# Patient Record
Sex: Female | Born: 2000 | Race: White | Hispanic: No | Marital: Single | Smoking: Never smoker
Health system: Southern US, Community
[De-identification: ages and names within clinical notes are randomized; demographics above are authoritative.]

## PROBLEM LIST (undated history)

## (undated) DIAGNOSIS — E162 Hypoglycemia, unspecified: Secondary | ICD-10-CM

## (undated) DIAGNOSIS — F4310 Post-traumatic stress disorder, unspecified: Secondary | ICD-10-CM

## (undated) DIAGNOSIS — F419 Anxiety disorder, unspecified: Secondary | ICD-10-CM

## (undated) HISTORY — PX: WRIST SURGERY: SHX841

## (undated) HISTORY — PX: OTHER SURGICAL HISTORY: SHX169

---
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
## 2018-03-29 NOTE — Progress Notes (Deleted)
Psychiatric Initial Adult Assessment   Patient Identification: Veronica Best MRN:  [id number] Date of Evaluation:  03/29/2018 Referral Source: *** Chief Complaint:   Visit Diagnosis: No diagnosis found.  History of Present Illness:   Veronica Best is a 17 y.o. year old female with a history of  , who is referred for   Associated Signs/Symptoms: Depression Symptoms:  {DEPRESSION SYMPTOMS:20000} (Hypo) Manic Symptoms:  {BHH MANIC SYMPTOMS:22872} Anxiety Symptoms:  {BHH ANXIETY SYMPTOMS:22873} Psychotic Symptoms:  {BHH PSYCHOTIC SYMPTOMS:22874} PTSD Symptoms: {BHH PTSD SYMPTOMS:22875}  Past Psychiatric History:  Outpatient:  Psychiatry admission:  Previous suicide attempt:  Past trials of medication:  History of violence:   Previous Psychotropic Medications: {YES/NO:21197}  Substance Abuse History in the last 12 months:  {yes no:314532}  Consequences of Substance Abuse: {BHH CONSEQUENCES OF SUBSTANCE ABUSE:22880}  Past Medical History:  Past Medical History:  Diagnosis Date  . Hypoglycemia     Past Surgical History:  Procedure Laterality Date  . tubes in ears      Family Psychiatric History: ***  Family History:  Family History  Problem Relation Age of Onset  . Cancer Neg Hx     Social History:   Social History   Socioeconomic History  . Marital status: Single    Spouse name: Not on file  . Number of children: Not on file  . Years of education: Not on file  . Highest education level: Not on file  Occupational History  . Not on file  Social Needs  . Financial resource strain: Not on file  . Food insecurity:    Worry: Not on file    Inability: Not on file  . Transportation needs:    Medical: Not on file    Non-medical: Not on file  Tobacco Use  . Smoking status: Never Smoker  . Smokeless tobacco: Never Used  Substance and Sexual Activity  . Alcohol use: No  . Drug use: No  . Sexual activity: Never  Lifestyle  . Physical activity:    Days per  week: Not on file    Minutes per session: Not on file  . Stress: Not on file  Relationships  . Social connections:    Talks on phone: Not on file    Gets together: Not on file    Attends religious service: Not on file    Active member of club or organization: Not on file    Attends meetings of clubs or organizations: Not on file    Relationship status: Not on file  Other Topics Concern  . Not on file  Social History Narrative  . Not on file    Additional Social History: ***  Allergies:   Allergies  Allergen Reactions  . Latex Itching    Metabolic Disorder Labs: No results found for: HGBA1C, MPG No results found for: PROLACTIN No results found for: CHOL, TRIG, HDL, CHOLHDL, VLDL, LDLCALC No results found for: TSH  Therapeutic Level Labs: No results found for: LITHIUM No results found for: CBMZ No results found for: VALPROATE  Current Medications: No current outpatient medications on file.   No current facility-administered medications for this visit.     Musculoskeletal: Strength & Muscle Tone: within normal limits Gait & Station: normal Patient leans: N/A  Psychiatric Specialty Exam: ROS  There were no vitals taken for this visit.There is no height or weight on file to calculate BMI.  General Appearance: Fairly Groomed  Eye Contact:  Good  Speech:  Clear and Coherent  Volume:  Normal  Mood:  {BHH MOOD:22306}  Affect:  {Affect (PAA):22687}  Thought Process:  Coherent  Orientation:  Full (Time, Place, and Person)  Thought Content:  Logical  Suicidal Thoughts:  {ST/HT (PAA):22692}  Homicidal Thoughts:  {ST/HT (PAA):22692}  Memory:  Immediate;   Good  Judgement:  {Judgement (PAA):22694}  Insight:  {Insight (PAA):22695}  Psychomotor Activity:  Normal  Concentration:  Concentration: Good and Attention Span: Good  Recall:  Good  Fund of Knowledge:Good  Language: Good  Akathisia:  No  Handed:  Right  AIMS (if indicated):  not done  Assets:   Communication Skills Desire for Improvement  ADL's:  Intact  Cognition: WNL  Sleep:  {BHH GOOD/FAIR/POOR:22877}   Screenings:   Assessment and Plan:  Veronica Best is a 17 y.o. year old female with a history of , who is referred for    Neysa Hottereina Dicky Boer, MD 12/10/20192:24 PM

## 2018-04-04 ENCOUNTER — Ambulatory Visit (HOSPITAL_COMMUNITY): Payer: Self-pay | Admitting: Psychiatry

## 2020-01-08 ENCOUNTER — Other Ambulatory Visit: Payer: Self-pay

## 2020-01-08 ENCOUNTER — Encounter (HOSPITAL_COMMUNITY): Payer: Self-pay | Admitting: Emergency Medicine

## 2020-01-08 ENCOUNTER — Emergency Department (HOSPITAL_COMMUNITY)
Admission: EM | Admit: 2020-01-08 | Discharge: 2020-01-08 | Disposition: A | Discharge status: Home / Self Care | Payer: Self-pay | Attending: Emergency Medicine | Admitting: Emergency Medicine

## 2020-01-08 ENCOUNTER — Emergency Department (HOSPITAL_COMMUNITY): Payer: Self-pay

## 2020-01-08 DIAGNOSIS — Z91040 Latex allergy status: Secondary | ICD-10-CM | POA: Insufficient documentation

## 2020-01-08 DIAGNOSIS — S63501A Unspecified sprain of right wrist, initial encounter: Secondary | ICD-10-CM | POA: Insufficient documentation

## 2020-01-08 DIAGNOSIS — W5512XA Struck by horse, initial encounter: Secondary | ICD-10-CM | POA: Insufficient documentation

## 2020-01-08 MED ORDER — IBUPROFEN 600 MG PO TABS: 600.0000 mg | ORAL_TABLET | Freq: Four times a day (QID) | ORAL | 0 refills | Status: AC | PRN

## 2020-01-08 NOTE — Discharge Instructions (Addendum)
Your wrist pain is likely due to a sprain.  Continue to wear your brace.  Take ibuprofen as needed for pain.  Call and follow-up with hand specialist for further management.

## 2020-01-08 NOTE — ED Provider Notes (Signed)
[facility] EMERGENCY DEPARTMENT Provider Note   CSN: [id number] Arrival date & time: 01/08/20  1031     History Chief Complaint  Patient presents with  . Wrist Injury    Right    Veronica Best is a 19 y.o. female.  The history is provided by the patient. No language interpreter was used.  Wrist Injury Associated symptoms: no fever      19 year old female presenting for evaluation of wrist pain.  Patient report about 40 days ago she injured her right nondominant wrist when she was holding her horse and the horse got spooked and pulled away from her.  She complaining of progressive worsening sharp burning pain to her wrist radiates to her hand and towards elbow when she moves with certain direction.  Pain is moderate in severity but not improving despite wearing a brace for support.  No associated fever no weakness no bruising or swelling noted.  She has been taking over-the-counter pain medication at home.  She is concerned because her symptoms is worsening instead of improving.  Past Medical History:  Diagnosis Date  . Hypoglycemia     There are no problems to display for this patient.   Past Surgical History:  Procedure Laterality Date  . tubes in ears       OB History   No obstetric history on file.     Family History  Problem Relation Age of Onset  . Cancer Neg Hx     Social History   Tobacco Use  . Smoking status: Never Smoker  . Smokeless tobacco: Never Used  Substance Use Topics  . Alcohol use: No  . Drug use: No    Home Medications Prior to Admission medications   Not on File    Allergies    Latex  Review of Systems   Review of Systems  Constitutional: Negative for fever.  Musculoskeletal: Positive for arthralgias.  Skin: Negative for wound.  Neurological: Negative for numbness.    Physical Exam Updated Vital Signs BP 122/80 (BP Location: Left Arm)   Pulse 80   Temp 97.9 F (36.6 C) (Oral)   Resp 18   Ht 5\' 2"  (1.575 m)   Wt 58.9  kg   LMP 01/07/2020   SpO2 100%   BMI 23.76 kg/m   Physical Exam Vitals and nursing note reviewed.  Constitutional:      General: She is not in acute distress.    Appearance: She is well-developed.  HENT:     Head: Atraumatic.  Eyes:     Conjunctiva/sclera: Conjunctivae normal.  Musculoskeletal:        General: Tenderness (Right wrist: Tenderness to the ulnar aspect of the wrist with normal wrist flexion extension supination and pronation.  Normal grip strength, able to move all fingers with intact distal pulses and brisk cap refill.  Radial pulse 2+.) present.     Cervical back: Neck supple.  Skin:    Findings: No rash.  Neurological:     Mental Status: She is alert.     ED Results / Procedures / Treatments   Labs (all labs ordered are listed, but only abnormal results are displayed) Labs Reviewed - No data to display  EKG None  Radiology DG Wrist Complete Right  Result Date: 01/08/2020 CLINICAL DATA:  Patient was walking the horse and horse pulled away from her, injury right wrist on August 9. EXAM: RIGHT WRIST - COMPLETE 3+ VIEW COMPARISON:  None. FINDINGS: There is no evidence of fracture or  dislocation. There is no evidence of arthropathy or other focal bone abnormality. Soft tissues are unremarkable. IMPRESSION: Negative. Electronically Signed   By: Emmaline Kluver M.D.   On: 01/08/2020 12:20    Procedures Procedures (including critical care time)  Medications Ordered in ED Medications - No data to display  ED Course  I have reviewed the triage vital signs and the nursing notes.  Pertinent labs & imaging results that were available during my care of the patient were reviewed by me and considered in my medical decision making (see chart for details).    MDM Rules/Calculators/A&P                          BP 122/80 (BP Location: Left Arm)   Pulse 80   Temp 97.9 F (36.6 C) (Oral)   Resp 18   Ht 5\' 2"  (1.575 m)   Wt 58.9 kg   LMP 01/07/2020   SpO2  100%   BMI 23.76 kg/m   Final Clinical Impression(s) / ED Diagnoses Final diagnoses:  Right wrist sprain, initial encounter    Rx / DC Orders ED Discharge Orders         Ordered    ibuprofen (ADVIL) 600 MG tablet  Every 6 hours PRN        01/08/20 1227         12:24 PM Patient here with pain to her right nondominant wrist ongoing for more than a month when she was walking her horse and the horse pulled away from her.  She has full range of motion throughout her right wrist.  X-ray unremarkable.  I suspect this is likely to be musculoskeletal pain, likely a sprain.  Patient does have a brace available which I encourage patient to continue to use it and I will give referral to hand specialist for outpatient follow-up.   01/10/20, PA-C 01/08/20 1227    01/10/20, MD 01/09/20 1335

## 2020-01-08 NOTE — ED Triage Notes (Signed)
Patient was walking the horse and horse pulled away from her, injury right wrist on August 9.

## 2020-03-03 ENCOUNTER — Emergency Department (HOSPITAL_COMMUNITY)
Admission: EM | Admit: 2020-03-03 | Discharge: 2020-03-03 | Disposition: A | Discharge status: Home / Self Care | Payer: Medicaid Other | Attending: Emergency Medicine | Admitting: Emergency Medicine

## 2020-03-03 ENCOUNTER — Other Ambulatory Visit: Payer: Self-pay

## 2020-03-03 ENCOUNTER — Encounter (HOSPITAL_COMMUNITY): Payer: Self-pay

## 2020-03-03 DIAGNOSIS — M545 Low back pain, unspecified: Secondary | ICD-10-CM

## 2020-03-03 DIAGNOSIS — Y33XXXA Other specified events, undetermined intent, initial encounter: Secondary | ICD-10-CM | POA: Insufficient documentation

## 2020-03-03 DIAGNOSIS — X509XXA Other and unspecified overexertion or strenuous movements or postures, initial encounter: Secondary | ICD-10-CM | POA: Insufficient documentation

## 2020-03-03 LAB — POC URINE PREG, ED: Preg Test, Ur: NEGATIVE

## 2020-03-03 MED ORDER — ACETAMINOPHEN 325 MG PO TABS
650.0000 mg | ORAL_TABLET | Freq: Once | ORAL | Status: AC
  Administered 2020-03-03: 650 mg via ORAL
  Filled 2020-03-03: qty 2

## 2020-03-03 MED ORDER — NAPROXEN 250 MG PO TABS
500.0000 mg | ORAL_TABLET | Freq: Once | ORAL | Status: AC
  Administered 2020-03-03: 500 mg via ORAL
  Filled 2020-03-03: qty 2

## 2020-03-03 MED ORDER — LIDOCAINE 5 % EX PTCH
1.0000 | MEDICATED_PATCH | Freq: Once | CUTANEOUS | Status: DC
  Administered 2020-03-03: 1 via TRANSDERMAL
  Filled 2020-03-03: qty 1

## 2020-03-03 MED ORDER — PREDNISONE 10 MG PO TABS
20.0000 mg | ORAL_TABLET | Freq: Every day | ORAL | 0 refills | Status: AC
Start: 2020-03-04 — End: 2020-03-08

## 2020-03-03 MED ORDER — PREDNISONE 50 MG PO TABS
60.0000 mg | ORAL_TABLET | Freq: Once | ORAL | Status: AC
  Administered 2020-03-03: 60 mg via ORAL
  Filled 2020-03-03: qty 1
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
## 2020-03-03 NOTE — ED Provider Notes (Signed)
[facility] EMERGENCY DEPARTMENT Provider Note   CSN: [id number] Arrival date & time: 03/03/20  1009     History Chief Complaint  Patient presents with  . Back Pain    Veronica Best is a 19 y.o. female with noncontributory past medical history.  HPI Patient presents to emergency department today with chief complaint of back pain x2 days.  She states pain has progressively worsened.  She was bending over to pick something up and had a sudden onset of left low back pain.  She states the pain will radiate across her low back. She describes the pain as sharp and throbbing.  She rates the pain 7 out of 10 in severity. She thinks it feels like a muscle spasm. She tried taking ibuprofen yesterday without any symptom improvement.  She states she has a history of back issues ever since being in [facility] as a child.  She also rides horses and states when sitting in certain positions it can cause her to have back pain.  She was recently riding her horse. Denies fevers, weight loss, numbness/weakness of upper and lower extremities, bowel/bladder incontinence, urinary retention, history of cancer, saddle anesthesia, history of back surgery, history of IVDA.     Past Medical History:  Diagnosis Date  . Hypoglycemia     There are no problems to display for this patient.   Past Surgical History:  Procedure Laterality Date  . tubes in ears       OB History   No obstetric history on file.     Family History  Problem Relation Age of Onset  . Cancer Neg Hx     Social History   Tobacco Use  . Smoking status: Never Smoker  . Smokeless tobacco: Never Used  Vaping Use  . Vaping Use: Never used  Substance Use Topics  . Alcohol use: Yes  . Drug use: No    Home Medications Prior to Admission medications   Medication Sig Start Date End Date Taking? Authorizing Provider  ibuprofen (ADVIL) 600 MG tablet Take 1 tablet (600 mg total) by mouth every 6 (six) hours as needed. 01/08/20   Fayrene Helper, PA-C  predniSONE (DELTASONE) 10 MG tablet Take 2 tablets (20 mg total) by mouth daily for 4 days. 03/04/20 03/08/20  Shanon Ace, PA-C    Allergies    Latex  Review of Systems   Review of Systems All other systems are reviewed and are negative for acute change except as noted in the HPI.  Physical Exam Updated Vital Signs BP 116/79 (BP Location: Left Arm)   Pulse 85   Temp 98.5 F (36.9 C) (Oral)   Resp 18   Ht 5\' 2"  (1.575 m)   Wt 57.6 kg   SpO2 99%   BMI 23.23 kg/m   Physical Exam Vitals and nursing note reviewed.  Constitutional:      Appearance: She is well-developed. She is not ill-appearing or toxic-appearing.  HENT:     Head: Normocephalic and atraumatic.     Nose: Nose normal.  Eyes:     General: No scleral icterus.       Right eye: No discharge.        Left eye: No discharge.     Conjunctiva/sclera: Conjunctivae normal.  Neck:     Vascular: No JVD.  Cardiovascular:     Rate and Rhythm: Normal rate and regular rhythm.     Pulses: Normal pulses.     Heart sounds: Normal heart sounds.  Pulmonary:     Effort: Pulmonary effort is normal.     Breath sounds: Normal breath sounds.  Abdominal:     General: There is no distension.  Musculoskeletal:        General: Normal range of motion.     Cervical back: Normal range of motion.       Back:     Comments: Tenderness to palpation as depicted in image above. No overlying skin changes.  Full range of motion of the T-spine and L-spine No tenderness to palpation of the spinous processes of the T-spine or L-spine No crepitus, deformity or step-offs      Skin:    General: Skin is warm and dry.  Neurological:     Mental Status: She is oriented to person, place, and time.     GCS: GCS eye subscore is 4. GCS verbal subscore is 5. GCS motor subscore is 6.     Comments: Fluent speech, no facial droop.  Sensation grossly intact to light touch in the lower extremities bilaterally. No saddle  anesthesias. Strength 5/5 with flexion and extension at the bilateral hips, knees, and ankles. No noted gait deficit. Coordination intact with heel to shin testing.   Psychiatric:        Behavior: Behavior normal.     ED Results / Procedures / Treatments   Labs (all labs ordered are listed, but only abnormal results are displayed) Labs Reviewed  POC URINE PREG, ED    EKG None  Radiology No results found.  Procedures Procedures (including critical care time)  Medications Ordered in ED Medications  lidocaine (LIDODERM) 5 % 1 patch (1 patch Transdermal Patch Applied 03/03/20 1223)  naproxen (NAPROSYN) tablet 500 mg (500 mg Oral Given 03/03/20 1222)  predniSONE (DELTASONE) tablet 60 mg (60 mg Oral Given 03/03/20 1223)  acetaminophen (TYLENOL) tablet 650 mg (650 mg Oral Given 03/03/20 1222)    ED Course  I have reviewed the triage vital signs and the nursing notes.  Pertinent labs & imaging results that were available during my care of the patient were reviewed by me and considered in my medical decision making (see chart for details).    MDM Rules/Calculators/A&P                          History provided by patient with additional history obtained from chart review.    Patient with back pain.  No neurological deficits and normal neuro exam.  Patient can walk but states is painful.  No loss of bowel or bladder control.  No concern for cauda equina.  No fever, night sweats, weight loss, h/o cancer, IVDU.  Pregnancy test is negative. Pain improved after interventions. Will discharge with short burst of prednisone. Recommend ibuprofen and tylenol as well as OTC lidocaine patches. Stable to discharge home. All questions answered and she is agreeable with plan of care.   Portions of this note were generated with Scientist, clinical (histocompatibility and immunogenetics). Dictation errors may occur despite best attempts at proofreading.    Final Clinical Impression(s) / ED Diagnoses Final diagnoses:  Acute  left-sided low back pain without sciatica    Rx / DC Orders ED Discharge Orders         Ordered    predniSONE (DELTASONE) 10 MG tablet  Daily        03/03/20 1300           Shanon Ace, PA-C 03/03/20 1305    Mancel Bale,  MD 03/03/20 1507

## 2020-03-03 NOTE — ED Notes (Signed)
ED Provider at bedside. 

## 2020-03-03 NOTE — Discharge Instructions (Signed)
Your back pain should be treated with medicines such as ibuprofen or tylenol and this back pain should get better over the next 2 weeks.   -Prescription for prednisone sent to your pharmacy. Start taking tomorrow. You already had your dose for today. -Also recommend you try taking Tylenol or ibuprofen as we discussed. -You can buy over-the-counter lidocaine patches to help with your pain.  Follow Up: Please follow up with your primary healthcare provider in 1-2 weeks for reassessment. if you do not have a primary care doctor use the resource guide provided to find one.  Low back pain is discomfort in the lower back that may be due to injuries to muscles and ligaments around the spine. Occasionally, it may be caused by a a problem to a part of the spine called a disc. The pain may last several days or a week;  However, most patients get completely well in 4 weeks.   1. Medications: Alternate 600 mg of ibuprofen and [phone] mg of Tylenol every 3 hours as needed for pain. Do not exceed 4000 mg of Tylenol daily.  Take ibuprofen with food to avoid upset stomach issues.   Muscle relaxants:  These medications can help with muscle tightness that is a cause of lower back pain. Most of these medications can cause drowsiness, and it is not safe to drive or use dangerous machinery while taking them.You can take Flexeril as needed for muscle spasm up to twice daily but do not drive, drink alcohol, or operate heavy machinery while taking this medicine because it may make you drowsy.  I typically recommend taking this medicine only at night when you are going to sleep.  You can also cut these tablets in half if they make you feel very drowsy.  2. Treatment: rest, drink plenty of fluids, gentle stretching as discussed (see attached), alternate ice and heat (or stick with whichever feels best) 20 minutes on 20 minutes off. Maintaining your daily activities, including walking, is encourged, as it will help you get  better faster than just staying in bed.    Be aware that if you develop new symptoms, such as a fever, leg weakness, difficulty with or loss of control of your urine or bowels, abdominal pain, or more severe pain, you will need to seek medical attention immediately and  / or return to the Emergency department.

## 2020-12-02 DIAGNOSIS — M778 Other enthesopathies, not elsewhere classified: Secondary | ICD-10-CM | POA: Insufficient documentation

## 2021-02-04 ENCOUNTER — Ambulatory Visit
Admission: EM | Admit: 2021-02-04 | Discharge: 2021-02-04 | Disposition: A | Discharge status: Home / Self Care | Payer: Medicaid Other | Attending: Family Medicine | Admitting: Family Medicine

## 2021-02-04 ENCOUNTER — Other Ambulatory Visit: Payer: Self-pay

## 2021-02-04 DIAGNOSIS — R509 Fever, unspecified: Secondary | ICD-10-CM | POA: Insufficient documentation

## 2021-02-04 DIAGNOSIS — J029 Acute pharyngitis, unspecified: Secondary | ICD-10-CM | POA: Insufficient documentation

## 2021-02-04 HISTORY — DX: Post-traumatic stress disorder, unspecified: F43.10

## 2021-02-04 HISTORY — DX: Anxiety disorder, unspecified: F41.9

## 2021-02-04 LAB — POCT RAPID STREP A (OFFICE): Rapid Strep A Screen: NEGATIVE

## 2021-02-04 MED ORDER — AMOXICILLIN 875 MG PO TABS: 875.0000 mg | ORAL_TABLET | Freq: Two times a day (BID) | ORAL | 0 refills | Status: AC

## 2021-02-04 NOTE — ED Provider Notes (Signed)
  [facility]ARE [facility]ENTER   [id number] 02/04/21 Arrival Time: 1136  ASSESSMENT & PLAN:  1. Sore throat   2. Fever, unspecified fever cause    No signs of peritonsillar abscess. Discussed. Exam suspicious for strep. Begin: Meds ordered this encounter  Medications   amoxicillin (AMOXIL) 875 MG tablet    Sig: Take 1 tablet (875 mg total) by mouth 2 (two) times daily for 10 days.    Dispense:  20 tablet    Refill:  0    Results for orders placed or performed during the hospital encounter of 02/04/21  POCT rapid strep A  Result Value Ref Range   Rapid Strep A Screen Negative Negative   Labs Reviewed  CULTURE, GROUP A STREP [facility])  POCT RAPID STREP A (OFFICE)    OTC analgesics and throat care as needed  Instructed to finish full 10 day course of antibiotics. Will follow up if not showing significant improvement over the next 24-48 hours.  Discharge Instructions   None    Reviewed expectations re: course of current medical issues. Questions answered. Outlined signs and symptoms indicating need for more acute intervention. Patient verbalized understanding. After Visit Summary given.   SUBJECTIVE:  Veronica Best is a 19 y.o. female who reports a sore throat; abrupt onset; sev d ago; ques subj temp; with HA. No resp symptoms. No tx PTA. Tolerating PO intake.  OBJECTIVE:  Vitals:   02/04/21 1217 02/04/21 1218  BP: 100/71   Pulse: (!) 128   Resp: 14   Temp: 99.9 F (37.7 C)   TempSrc: Oral   SpO2: 98%   Weight:  57.6 kg    Tachycardia noted; regular General appearance: alert; no distress HEENT: throat with moderate erythema; enlarged exudative tonsils; uvula is midline Neck: supple with FROM; small bilat cervical LAD Lungs: speaks full sentences without difficulty; unlabored Abd: soft; non-tender Skin: reveals no rash; warm and dry Psychological: alert and cooperative; normal mood and affect  Allergies  Allergen Reactions   Latex Itching    Past Medical  History:  Diagnosis Date   Anxiety    Hypoglycemia    PTSD (post-traumatic stress disorder)    Social History   Socioeconomic History   Marital status: Single    Spouse name: Not on file   Number of children: Not on file   Years of education: Not on file   Highest education level: Not on file  Occupational History   Not on file  Tobacco Use   Smoking status: Never   Smokeless tobacco: Never  Vaping Use   Vaping Use: Never used  Substance and Sexual Activity   Alcohol use: Yes   Drug use: No   Sexual activity: Never  Other Topics Concern   Not on file  Social History Narrative   Not on file   Social Determinants of Health   Financial Resource Strain: Not on file  Food Insecurity: Not on file  Transportation Needs: Not on file  Physical Activity: Not on file  Stress: Not on file  Social Connections: Not on file  Intimate Partner Violence: Not on file   Family History  Problem Relation Age of Onset   Cancer Neg Hx            Mardella Layman, MD 02/04/21 1641

## 2021-02-04 NOTE — ED Triage Notes (Signed)
Pt presents with sore throat, rash, fever, and head ache that began last week

## 2021-02-08 LAB — CULTURE, GROUP A STREP (THRC)

## 2021-07-23 IMAGING — DX DG WRIST COMPLETE 3+V*R*
4 series · 4 of 4 positions shown · non-contrast
Comparison: None.

CLINICAL DATA: Patient was walking the horse and horse pulled away
from her, injury right wrist on [DATE].

EXAM:
RIGHT WRIST - COMPLETE 3+ VIEW

[wrist pa]
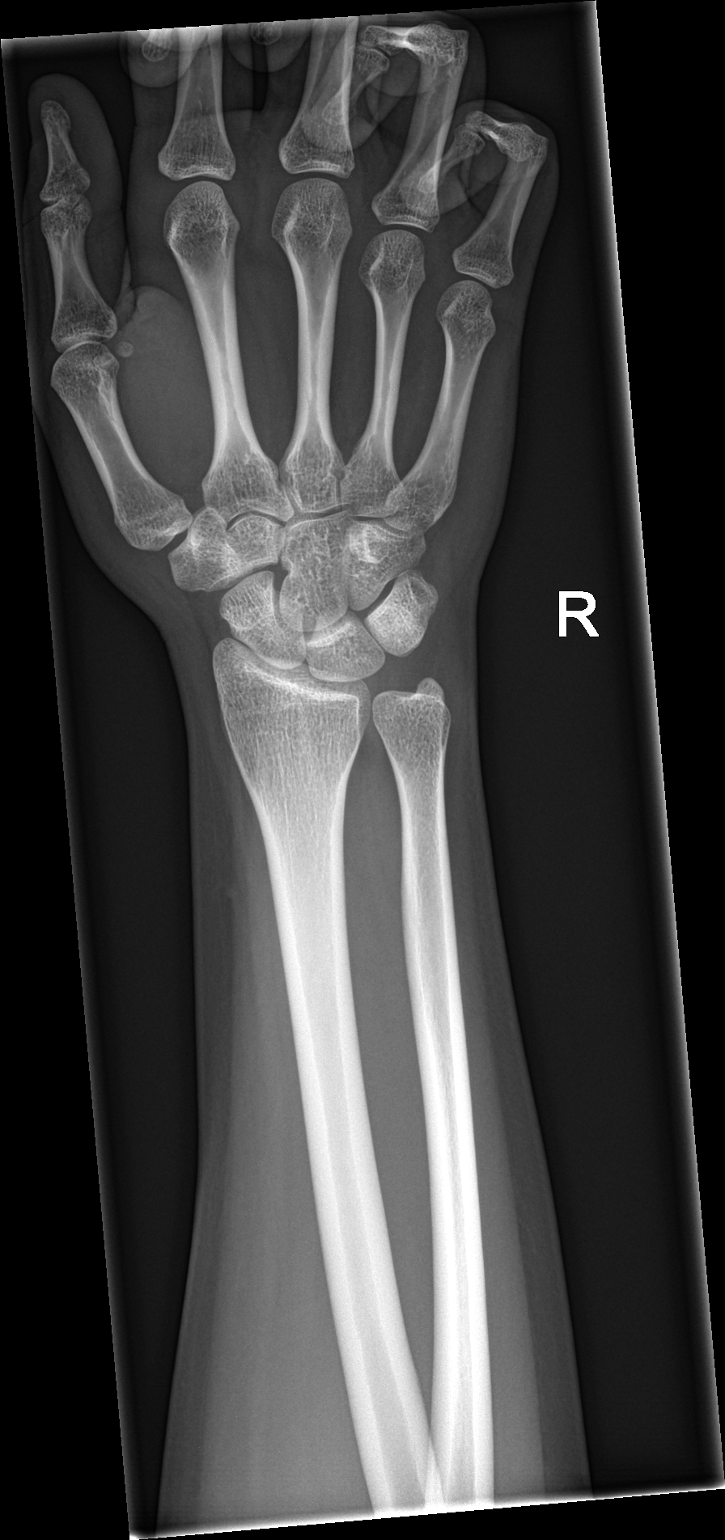

[wrist navicular]
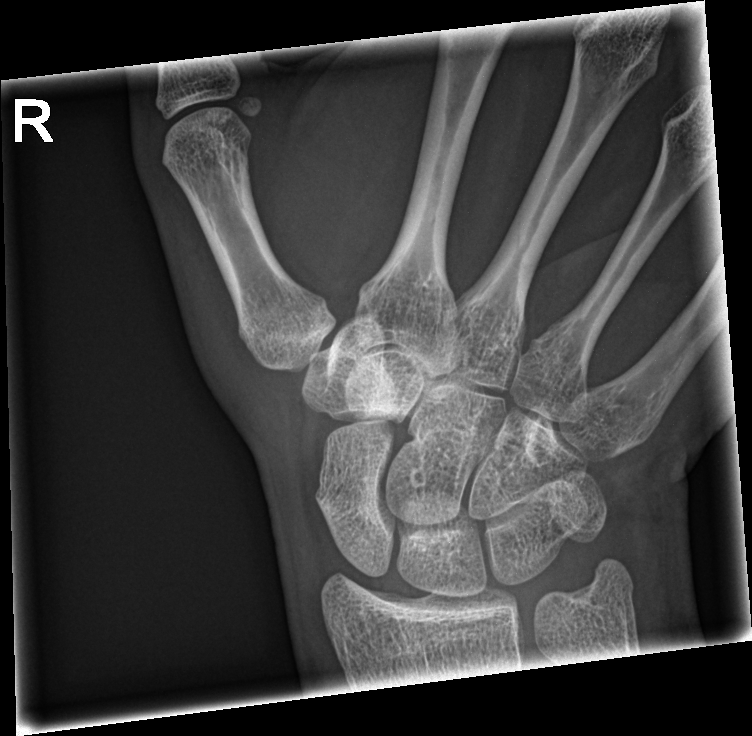

[wrist obl]
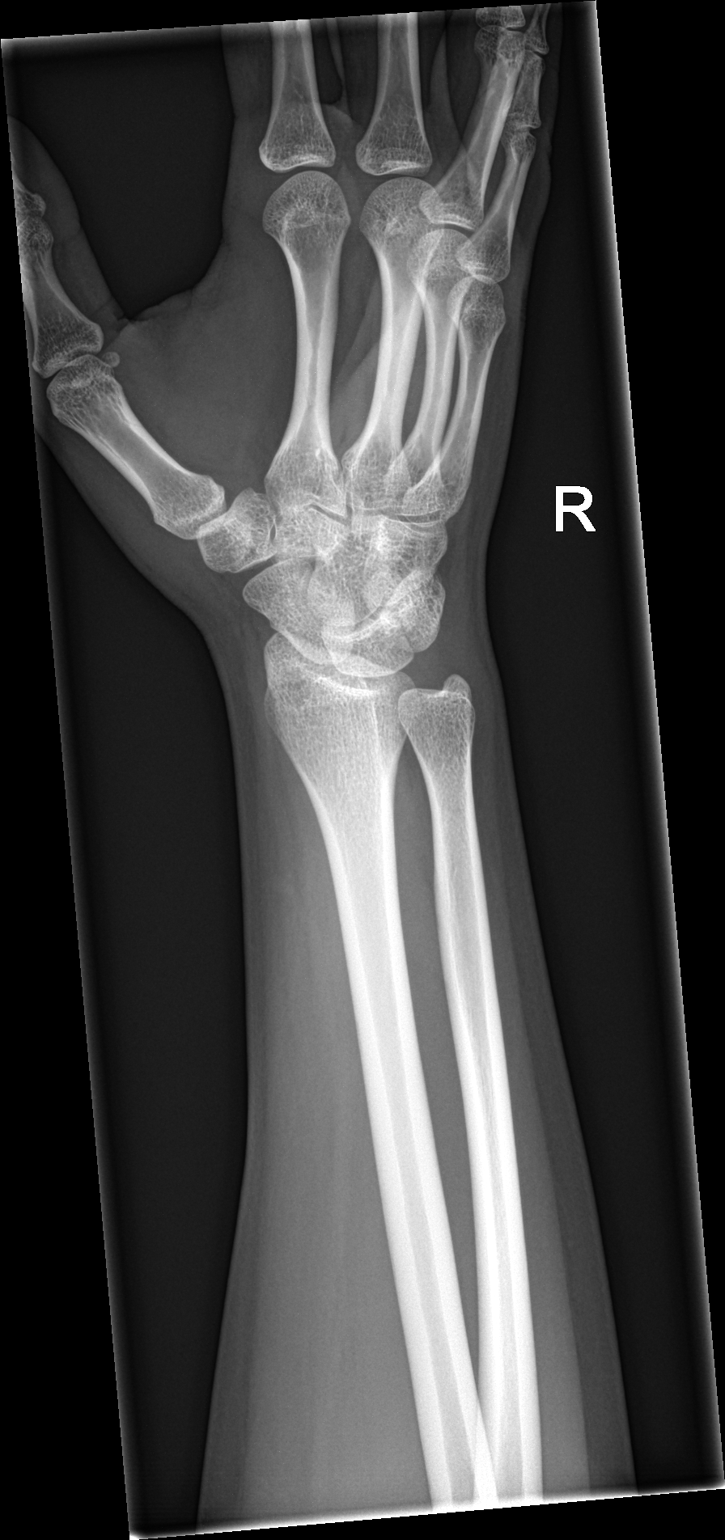

[wrist lat]
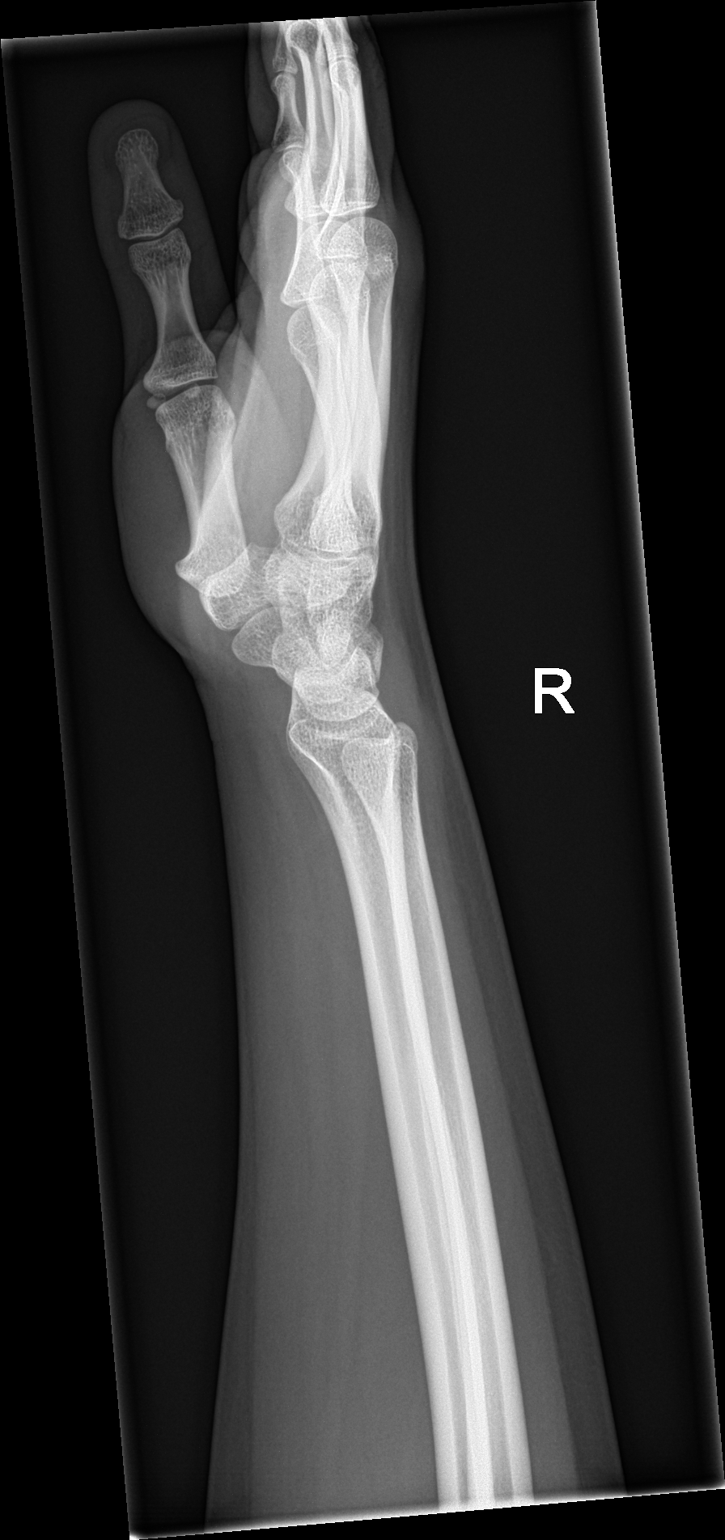

[4 of 4 positions shown; findings below may reference images not displayed]

FINDINGS: There is no evidence of fracture or dislocation. There is no
evidence of arthropathy or other focal bone abnormality. Soft
tissues are unremarkable.
IMPRESSION: Negative.

## 2022-02-26 ENCOUNTER — Encounter: Payer: Self-pay | Admitting: Physician Assistant

## 2022-02-26 ENCOUNTER — Ambulatory Visit (INDEPENDENT_AMBULATORY_CARE_PROVIDER_SITE_OTHER): Payer: No Typology Code available for payment source | Admitting: Physician Assistant

## 2022-02-26 VITALS — BP 110/67 | HR 115 | Temp 97.5°F | Ht 62.0 in | Wt 140.4 lb

## 2022-02-26 DIAGNOSIS — F429 Obsessive-compulsive disorder, unspecified: Secondary | ICD-10-CM | POA: Diagnosis not present

## 2022-02-26 DIAGNOSIS — F419 Anxiety disorder, unspecified: Secondary | ICD-10-CM

## 2022-02-26 DIAGNOSIS — R6889 Other general symptoms and signs: Secondary | ICD-10-CM

## 2022-02-26 DIAGNOSIS — F39 Unspecified mood [affective] disorder: Secondary | ICD-10-CM | POA: Diagnosis not present

## 2022-02-26 DIAGNOSIS — Z23 Encounter for immunization: Secondary | ICD-10-CM

## 2022-02-26 DIAGNOSIS — F32A Depression, unspecified: Secondary | ICD-10-CM

## 2022-02-26 MED ORDER — PRAZOSIN HCL 2 MG PO CAPS: 2.0000 mg | ORAL_CAPSULE | Freq: Every day | ORAL | 0 refills | Status: DC

## 2022-02-26 MED ORDER — QUETIAPINE FUMARATE 100 MG PO TABS: 100.0000 mg | ORAL_TABLET | Freq: Every day | ORAL | 2 refills | Status: DC

## 2022-02-26 NOTE — Progress Notes (Signed)
Subjective:    Patient ID: Veronica Best, female    DOB: Jul 10, 2000, 21 y.o.   MRN: [id number]  Chief Complaint  Patient presents with   New Patient (Initial Visit)    Pt wants you to go over immunizations needed with her    HPI 21 y.o. patient presents today for new patient establishment with me.  No PCP in awhile. 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   Current Care Team: [place] psychiatrist not in network & needing med management here; able to see psychologist there    [facility] [place] [facility]   Acute Concerns: Needing help with medication management. -On Seroquel and Minipress for at least the last year, has been helping her tremendously. Feels like she's doing ok right now. Sleep is so-so and she has very vivid dreams, worse recently.    Past Medical History:  Diagnosis Date   Anxiety    Hypoglycemia    PTSD (post-traumatic stress disorder)     Past Surgical History:  Procedure Laterality Date   tubes in ears     WRIST SURGERY Right     Family History  Problem Relation Age of Onset   Bipolar disorder Sister    Breast cancer Maternal Grandmother        great grandmother   Cancer Neg Hx     Social History   Tobacco Use   Smoking status: Never   Smokeless tobacco: Never  Vaping Use   Vaping Use: Never used  Substance Use Topics   Alcohol use: Yes    Alcohol/week: 1.0 standard drink of alcohol    Types: 1 Glasses of wine per week   Drug use: No     Allergies  Allergen Reactions   Latex Itching    Review of Systems NEGATIVE UNLESS OTHERWISE INDICATED IN HPI      Objective:     BP 110/67 (BP Location: Left Arm, Patient Position: Sitting)   Pulse (!) 115   Temp (!) 97.5 F (36.4 C) (Temporal)   Ht 5\' 2"  (1.575 m)   Wt 140 lb 6.4 oz (63.7 kg)   SpO2 95%   BMI 25.68 kg/m   Wt Readings from Last 3 Encounters:  02/26/22 140 lb 6.4 oz (63.7 kg)  02/04/21 127 lb (57.6 kg)   03/03/20 127 lb (57.6 kg)    BP Readings from Last 3 Encounters:  02/26/22 110/67  02/04/21 100/71  03/03/20 116/79     Physical Exam Vitals and nursing note reviewed.  Constitutional:      Appearance: Normal appearance. She is normal weight. She is not toxic-appearing.  HENT:     Head: Normocephalic and atraumatic.  Eyes:     Extraocular Movements: Extraocular movements intact.     Conjunctiva/sclera: Conjunctivae normal.     Pupils: Pupils are equal, round, and reactive to light.  Cardiovascular:     Rate and Rhythm: Normal rate and regular rhythm.     Pulses: Normal pulses.     Heart sounds: Normal heart sounds.  Pulmonary:     Effort: Pulmonary effort is normal.     Breath sounds: Normal breath sounds.  Musculoskeletal:     Cervical back: Normal range of motion and neck supple.  Skin:    General: Skin is warm and dry.  Neurological:     General: No focal deficit present.     Mental Status: She is alert and oriented to  person, place, and time.  Psychiatric:        Mood and Affect: Mood normal.        Behavior: Behavior normal.        Assessment & Plan:  Anxiety and depression  Mood disorder (HCC)  Obsessive-compulsive disorder, unspecified type  Vivid dream  Need for Tdap vaccination -     Tdap vaccine greater than or equal to 7yo IM  Need for HPV vaccination -     HPV 9-valent vaccine,Recombinat  Other orders -     Prazosin HCl; Take 1 capsule (2 mg total) by mouth at bedtime.  Dispense: 30 capsule; Refill: 0 -     QUEtiapine Fumarate; Take 1 tablet (100 mg total) by mouth at bedtime.  Dispense: 30 tablet; Refill: 2   Very pleasant 21 year old new patient establishing care today.  She has been stable on Seroquel 100 mg at bedtime.  I refilled this medication for her.  She is starting to have recurrence of vivid dreams, which is when she started the prazosin initially for.  I will go ahead and increase prazosin to 2 mg at bedtime and she will let me  know in the next few weeks how she is doing with this.  Cautioned about low blood pressure and need to monitor this.  She will stay well-hydrated.  She will continue seeing [facility] for psychology appointments.  Informed patient that we can take over providing her Depo shots for her.  She is due for a well woman and Pap smear.  She also needs fasting labs as she is on Seroquel.  We can do all this at her next appointment.  Tdap updated today a and first HPV shot given.    Return in about 2 months (around 04/28/2022) for Well woman, fasting labs, depo shot .  This note was prepared with assistance of Conservation officer, historic buildings. Occasional wrong-word or sound-a-like substitutions may have occurred due to the inherent limitations of voice recognition software.    Taeler Winning M Kambra Beachem, PA-C

## 2022-02-26 NOTE — Patient Instructions (Addendum)
-  Recheck in approx 2-3 weeks - Virtual visit to check on Minipress -Approx 2 months for Well woman, fasting labs, depo shot    Welcome to [facility]! It was a pleasure meeting you today. -Tetanus updated and first HPV shot today  -Increase minipress to 2 mg at night  PLEASE NOTE:  If you had any LAB tests please let us know if you have not heard back within a few days. You may see your results on MyChart before we have a chance to review them but we will give you a call once they are reviewed by [place]. If we ordered any REFERRALS today, please let us know if you have not heard from their office within the next two weeks. Let us know through MyChart if you are needing REFILLS, or have your pharmacy send [place] the request. You can also use MyChart to communicate with me or any office staff.  Please try these tips to maintain a healthy lifestyle:  Eat most of your calories during the day when you are active. Eliminate processed foods including packaged sweets (pies, cakes, cookies), reduce intake of potatoes, white bread, white pasta, and white rice. Look for whole grain options, oat flour or almond flour.  Each meal should contain half fruits/vegetables, one quarter protein, and one quarter carbs (no bigger than a computer mouse).  Cut down on sweet beverages. This includes juice, soda, and sweet tea. Also watch fruit intake, though this is a healthier sweet option, it still contains natural sugar! Limit to 3 servings daily.  Drink at least 1 glass of water with each meal and aim for at least 8 glasses (64 ounces) per day.  Exercise at least 150 minutes every week to the best of your ability.    Take Care,  Mithcell Schumpert, PA-C

## 2022-03-28 ENCOUNTER — Other Ambulatory Visit: Payer: Self-pay | Admitting: Physician Assistant

## 2022-04-02 ENCOUNTER — Telehealth (INDEPENDENT_AMBULATORY_CARE_PROVIDER_SITE_OTHER): Payer: No Typology Code available for payment source | Admitting: Physician Assistant

## 2022-04-02 ENCOUNTER — Encounter: Payer: Self-pay | Admitting: Physician Assistant

## 2022-04-02 VITALS — Ht 62.0 in | Wt 140.0 lb

## 2022-04-02 DIAGNOSIS — G479 Sleep disorder, unspecified: Secondary | ICD-10-CM

## 2022-04-02 DIAGNOSIS — R6889 Other general symptoms and signs: Secondary | ICD-10-CM | POA: Diagnosis not present

## 2022-04-02 DIAGNOSIS — F419 Anxiety disorder, unspecified: Secondary | ICD-10-CM | POA: Diagnosis not present

## 2022-04-02 DIAGNOSIS — F32A Depression, unspecified: Secondary | ICD-10-CM

## 2022-04-02 MED ORDER — TRAZODONE HCL 50 MG PO TABS: 25.0000 mg | ORAL_TABLET | Freq: Every evening | ORAL | 3 refills | Status: DC | PRN

## 2022-04-02 NOTE — Progress Notes (Signed)
   Virtual Visit via Video Note  I connected with  Veronica Best  on 04/02/22 at 11:30 AM EST by a video enabled telemedicine application and verified that I am speaking with the correct person using two identifiers.  Location: Patient: home Provider: Nature conservation officer at [employer] Persons present: Patient and myself   I discussed the limitations of evaluation and management by telemedicine and the availability of in person appointments. The patient expressed understanding and agreed to proceed.   History of Present Illness:  21 yo female presents for discussion about vivid dreams / nightmares. Not sleeping well in the last 2 weeks. She is taking benadryl (for allergies) and Minipress 2 mg at bedtime. Says she doesn't watch the news. Reads often, but says it's books like [employer]. Doesn't watch much TV. No other new changes.    Observations/Objective:   Gen: Awake, alert, no acute distress Resp: Breathing is even and non-labored Psych: calm/pleasant demeanor Neuro: Alert and Oriented x 3, + facial symmetry, speech is clear.   Assessment and Plan:  Anxiety and depression  Vivid dream  Difficulty sleeping  Discussed with patient that the Benadryl might be increasing her vivid dreams.  Encouraged her to stop this medication.  She can use nasal saline and Flonase to help with nasal allergies.  She is going to continue on Minipress 2 mg at bedtime.  She will continue the Seroquel 100 mg dose that she is on as well.  Also gave her prescription for trazodone 50 mg to take 1/2-1 tab at bedtime as needed to help with sleeping.  She has taken this medicine before and done well.  Plan to follow-up with me in the next few weeks to let me know how she is doing.  Follow Up Instructions:    I discussed the assessment and treatment plan with the patient. The patient was provided an opportunity to ask questions and all were answered. The patient agreed with the plan and  demonstrated an understanding of the instructions.   The patient was advised to call back or seek an in-person evaluation if the symptoms worsen or if the condition fails to improve as anticipated.  Mikiala Fugett M Kila Godina, PA-C

## 2022-04-02 NOTE — Patient Instructions (Signed)
STOP the Benadryl You may take Trazodone 25 - 50 mg at bedtime Continue current minipress 2 mg  Cont Seroquel

## 2022-04-17 ENCOUNTER — Encounter: Payer: Self-pay | Admitting: Physician Assistant

## 2022-04-28 ENCOUNTER — Encounter: Payer: Self-pay | Admitting: Physician Assistant

## 2022-04-28 ENCOUNTER — Other Ambulatory Visit (HOSPITAL_COMMUNITY)
Admission: RE | Admit: 2022-04-28 | Discharge: 2022-04-28 | Disposition: A | Discharge status: Home / Self Care | Payer: No Typology Code available for payment source | Source: Ambulatory Visit | Attending: Physician Assistant | Admitting: Physician Assistant

## 2022-04-28 ENCOUNTER — Ambulatory Visit: Payer: No Typology Code available for payment source | Admitting: Physician Assistant

## 2022-04-28 VITALS — BP 110/74 | HR 95 | Temp 97.1°F | Ht 62.0 in | Wt 137.0 lb

## 2022-04-28 DIAGNOSIS — Z3042 Encounter for surveillance of injectable contraceptive: Secondary | ICD-10-CM | POA: Diagnosis not present

## 2022-04-28 DIAGNOSIS — Z23 Encounter for immunization: Secondary | ICD-10-CM

## 2022-04-28 DIAGNOSIS — Z0000 Encounter for general adult medical examination without abnormal findings: Secondary | ICD-10-CM | POA: Diagnosis not present

## 2022-04-28 DIAGNOSIS — Z114 Encounter for screening for human immunodeficiency virus [HIV]: Secondary | ICD-10-CM

## 2022-04-28 DIAGNOSIS — Z01419 Encounter for gynecological examination (general) (routine) without abnormal findings: Secondary | ICD-10-CM | POA: Insufficient documentation

## 2022-04-28 DIAGNOSIS — Z1159 Encounter for screening for other viral diseases: Secondary | ICD-10-CM

## 2022-04-28 LAB — LIPID PANEL
Cholesterol: 113 mg/dL (ref 0–200)
HDL: 62.8 mg/dL (ref >39.00)
LDL Cholesterol: 44 mg/dL (ref 0–99)
NonHDL: 50.27
Total CHOL/HDL Ratio: 2
Triglycerides: 29 mg/dL (ref 0.0–149.0)
VLDL: 5.8 mg/dL (ref 0.0–40.0)

## 2022-04-28 LAB — COMPREHENSIVE METABOLIC PANEL
ALT: 15 U/L (ref 0–35)
AST: 18 U/L (ref 0–37)
Albumin: 4.6 g/dL (ref 3.5–5.2)
Alkaline Phosphatase: 39 U/L (ref 39–117)
BUN: 16 mg/dL (ref 6–23)
CO2: 23 mEq/L (ref 19–32)
Calcium: 9.1 mg/dL (ref 8.4–10.5)
Chloride: 107 mEq/L (ref 96–112)
Creatinine, Ser: 0.92 mg/dL (ref 0.40–1.20)
GFR: 87.9 mL/min (ref >60.00)
Glucose, Bld: 94 mg/dL (ref 70–99)
Potassium: 4 mEq/L (ref 3.5–5.1)
Sodium: 140 mEq/L (ref 135–145)
Total Bilirubin: 0.5 mg/dL (ref 0.2–1.2)
Total Protein: 7.3 g/dL (ref 6.0–8.3)

## 2022-04-28 LAB — CBC WITH DIFFERENTIAL/PLATELET
Basophils Absolute: 0 10*3/uL (ref 0.0–0.1)
Basophils Relative: 0.6 % (ref 0.0–3.0)
Eosinophils Absolute: 0.1 10*3/uL (ref 0.0–0.7)
Eosinophils Relative: 2.1 % (ref 0.0–5.0)
HCT: 41.2 % (ref 36.0–46.0)
Hemoglobin: 14 g/dL (ref 12.0–15.0)
Lymphocytes Relative: 32.7 % (ref 12.0–46.0)
Lymphs Abs: 1.6 10*3/uL (ref 0.7–4.0)
MCHC: 34 g/dL (ref 30.0–36.0)
MCV: 90.9 fl (ref 78.0–100.0)
Monocytes Absolute: 0.4 10*3/uL (ref 0.1–1.0)
Monocytes Relative: 7.7 % (ref 3.0–12.0)
Neutro Abs: 2.8 10*3/uL (ref 1.4–7.7)
Neutrophils Relative %: 56.9 % (ref 43.0–77.0)
Platelets: 226 10*3/uL (ref 150.0–400.0)
RBC: 4.53 Mil/uL (ref 3.87–5.11)
RDW: 12.6 % (ref 11.5–15.5)
WBC: 5 10*3/uL (ref 4.0–10.5)

## 2022-04-28 LAB — TSH: TSH: 1.22 u[IU]/mL (ref 0.35–5.50)

## 2022-04-28 MED ORDER — MEDROXYPROGESTERONE ACETATE 150 MG/ML IM SUSP
150.0000 mg | Freq: Once | INTRAMUSCULAR | Status: AC
  Administered 2022-04-28: 150 mg via INTRAMUSCULAR

## 2022-04-28 NOTE — Telephone Encounter (Signed)
Please see pt response and advise 

## 2022-04-28 NOTE — Progress Notes (Signed)
Subjective:    Patient ID: Veronica Best, female    DOB: 27-Dec-2000, 22 y.o.   MRN: [id number]  Chief Complaint  Patient presents with   Annual Exam    Pt in office for annual CPE with fasting labs; no concerns everything is well;     HPI Patient is in today for annual CPE. She has never had a pap smear, will be due for this today. Sexually active, one female partner. No hx of STI that she is aware of. Also due for depo injection today.  Past Medical History:  Diagnosis Date   Anxiety    Hypoglycemia    PTSD (post-traumatic stress disorder)     Past Surgical History:  Procedure Laterality Date   tubes in ears     WRIST SURGERY Right     Family History  Problem Relation Age of Onset   Bipolar disorder Sister    Breast cancer Maternal Grandmother        great grandmother   Cancer Neg Hx     Social History   Tobacco Use   Smoking status: Never   Smokeless tobacco: Never  Vaping Use   Vaping Use: Never used  Substance Use Topics   Alcohol use: Yes    Alcohol/week: 1.0 standard drink of alcohol    Types: 1 Glasses of wine per week   Drug use: No     Allergies  Allergen Reactions   Latex Itching    Review of Systems NEGATIVE UNLESS OTHERWISE INDICATED IN HPI      Objective:     BP 110/74 (BP Location: Left Arm)   Pulse 95   Temp (!) 97.1 F (36.2 C) (Temporal)   Ht 5\' 2"  (1.575 m)   Wt 137 lb (62.1 kg)   SpO2 97%   BMI 25.06 kg/m   Wt Readings from Last 3 Encounters:  04/28/22 137 lb (62.1 kg)  04/02/22 140 lb (63.5 kg)  02/26/22 140 lb 6.4 oz (63.7 kg)    BP Readings from Last 3 Encounters:  04/28/22 110/74  02/26/22 110/67  02/04/21 100/71     Physical Exam Vitals and nursing note reviewed. Exam conducted with a chaperone present.  Constitutional:      Appearance: Normal appearance. She is normal weight. She is not toxic-appearing.  HENT:     Head: Normocephalic and atraumatic.  Eyes:     Extraocular Movements: Extraocular movements  intact.     Conjunctiva/sclera: Conjunctivae normal.     Pupils: Pupils are equal, round, and reactive to light.  Cardiovascular:     Rate and Rhythm: Normal rate and regular rhythm.     Pulses: Normal pulses.     Heart sounds: Normal heart sounds.  Pulmonary:     Effort: Pulmonary effort is normal.     Breath sounds: Normal breath sounds.  Chest:     Chest wall: No mass.  Breasts:    Right: Normal.     Left: Normal.  Abdominal:     General: Abdomen is flat. Bowel sounds are normal.     Palpations: Abdomen is soft.  Genitourinary:    General: Normal vulva.     Labia:        Right: No rash or tenderness.        Left: No rash or tenderness.      Vagina: Normal.     Cervix: Cervical bleeding present.     Uterus: Normal.      Adnexa: Right adnexa normal and  left adnexa normal.  Musculoskeletal:        General: Normal range of motion.     Cervical back: Normal range of motion and neck supple.  Lymphadenopathy:     Upper Body:     Right upper body: No supraclavicular adenopathy.     Left upper body: No supraclavicular adenopathy.  Skin:    General: Skin is warm and dry.  Neurological:     General: No focal deficit present.     Mental Status: She is alert and oriented to person, place, and time.  Psychiatric:        Mood and Affect: Mood normal.        Behavior: Behavior normal.        Thought Content: Thought content normal.        Judgment: Judgment normal.        Assessment & Plan:  Well woman exam with routine gynecological exam -     Cytology - PAP -     CBC with Differential/Platelet -     Comprehensive metabolic panel -     Lipid panel -     TSH  Need for HPV vaccination -     HPV 9-valent vaccine,Recombinat  Encounter for Depo-Provera contraception -     medroxyPROGESTERone Acetate  Encounter for screening for HIV -     HIV Antibody (routine testing w rflx)  Need for hepatitis C screening test -     Hepatitis C antibody   Age-appropriate screening  and counseling performed today. Will check labs and call with results. First pap completed today; she did great. Preventive measures discussed and printed in AVS for patient.   Patient Counseling: [x]   Nutrition: Stressed importance of moderation in sodium/caffeine intake, saturated fat and cholesterol, caloric balance, sufficient intake of fresh fruits, vegetables, and fiber.  [x]   Stressed the importance of regular exercise.   []   Substance Abuse: Discussed cessation/primary prevention of tobacco, alcohol, or other drug use; driving or other dangerous activities under the influence; availability of treatment for abuse.   []   Injury prevention: Discussed safety belts, safety helmets, smoke detector, smoking near bedding or upholstery.   [x]   Sexuality: Discussed sexually transmitted diseases, partner selection, use of condoms, avoidance of unintended pregnancy  and contraceptive alternatives.   [x]   Dental health: Discussed importance of regular tooth brushing, flossing, and dental visits.  [x]   Health maintenance and immunizations reviewed. Please refer to Health maintenance section.       Return in about 6 months (around 10/27/2022) for med recheck .     Mikaiah Stoffer M Laketia Vicknair, PA-C

## 2022-04-29 LAB — HIV ANTIBODY (ROUTINE TESTING W REFLEX): HIV 1&2 Ab, 4th Generation: NONREACTIVE

## 2022-04-29 LAB — HEPATITIS C ANTIBODY: Hepatitis C Ab: NONREACTIVE

## 2022-05-05 LAB — CYTOLOGY - PAP
Chlamydia: NEGATIVE
Comment: NEGATIVE
Comment: NEGATIVE
Comment: NEGATIVE
Comment: NEGATIVE
Comment: NORMAL
Diagnosis: NEGATIVE
HSV1: NEGATIVE
HSV2: NEGATIVE
High risk HPV: NEGATIVE
Neisseria Gonorrhea: NEGATIVE
Trichomonas: NEGATIVE

## 2022-05-26 ENCOUNTER — Other Ambulatory Visit: Payer: Self-pay | Admitting: Physician Assistant

## 2022-06-26 ENCOUNTER — Other Ambulatory Visit: Payer: Self-pay | Admitting: Physician Assistant

## 2022-07-04 ENCOUNTER — Encounter: Payer: Self-pay | Admitting: Physician Assistant

## 2022-07-14 ENCOUNTER — Ambulatory Visit (INDEPENDENT_AMBULATORY_CARE_PROVIDER_SITE_OTHER): Payer: No Typology Code available for payment source

## 2022-07-14 DIAGNOSIS — Z3042 Encounter for surveillance of injectable contraceptive: Secondary | ICD-10-CM

## 2022-07-14 MED ORDER — MEDROXYPROGESTERONE ACETATE 150 MG/ML IM SUSY: PREFILLED_SYRINGE | Freq: Once | INTRAMUSCULAR | Status: AC

## 2022-07-14 NOTE — Progress Notes (Signed)
Pt received Medroxyprogesterone Acetate 150mg  in right upper outer quadrant, pt tolerated well

## 2022-07-20 HISTORY — PX: HAND TENDON SURGERY: SHX663

## 2022-07-23 ENCOUNTER — Other Ambulatory Visit: Payer: Self-pay | Admitting: Physician Assistant

## 2022-07-24 ENCOUNTER — Emergency Department (HOSPITAL_COMMUNITY): Payer: No Typology Code available for payment source

## 2022-07-24 ENCOUNTER — Other Ambulatory Visit: Payer: Self-pay

## 2022-07-24 ENCOUNTER — Emergency Department (HOSPITAL_COMMUNITY)
Admission: EM | Admit: 2022-07-24 | Discharge: 2022-07-24 | Disposition: A | Discharge status: Home / Self Care | Payer: No Typology Code available for payment source | Attending: Emergency Medicine | Admitting: Emergency Medicine

## 2022-07-24 ENCOUNTER — Encounter (HOSPITAL_COMMUNITY): Payer: Self-pay

## 2022-07-24 DIAGNOSIS — Z91040 Latex allergy status: Secondary | ICD-10-CM | POA: Diagnosis not present

## 2022-07-24 DIAGNOSIS — W260XXA Contact with knife, initial encounter: Secondary | ICD-10-CM | POA: Diagnosis not present

## 2022-07-24 DIAGNOSIS — Y990 Civilian activity done for income or pay: Secondary | ICD-10-CM | POA: Diagnosis not present

## 2022-07-24 DIAGNOSIS — Y92000 Kitchen of unspecified non-institutional (private) residence as  the place of occurrence of the external cause: Secondary | ICD-10-CM | POA: Diagnosis not present

## 2022-07-24 DIAGNOSIS — S61213A Laceration without foreign body of left middle finger without damage to nail, initial encounter: Secondary | ICD-10-CM

## 2022-07-24 DIAGNOSIS — S6992XA Unspecified injury of left wrist, hand and finger(s), initial encounter: Secondary | ICD-10-CM | POA: Diagnosis present

## 2022-07-24 NOTE — ED Triage Notes (Addendum)
Pt was cutting lime tonight when the knife slipped and cut left  palm right below the middle finger. No bleeding. Pt states that she can not "feel anything or bend her finger". Pt appears to have anxiety.

## 2022-07-24 NOTE — Discharge Instructions (Addendum)
You were seen for your hand cut (laceration) in the emergency department.  You likely have a tendon that is involved and may have nerve involvement as well.  At home, please keep the wound clean and dry. Keep it covered with a bandage.    Follow-up with a hand doctor (orthopedic doctor) in 2-3 days regarding your visit.    Return immediately to the emergency department if you experience any of the following: Fevers, pain running up your arm or redness running up your arm, or any other concerning symptoms.    Thank you for visiting our Emergency Department. It was a pleasure taking care of you today.

## 2022-07-24 NOTE — ED Provider Notes (Signed)
  [place] EMERGENCY DEPARTMENT AT [facility] Provider Note   CSN: [id number] Arrival date & time: 07/24/22  1906     History {Add pertinent medical, surgical, social history, OB history to HPI:1} No chief complaint on file.   Veronica Best is a 22 y.o. female.  Knife wound lime L middle finger Steak knife Last tdap recent 02/2022       Home Medications Prior to Admission medications   Medication Sig Start Date End Date Taking? Authorizing Provider  ibuprofen (ADVIL) 600 MG tablet Take 1 tablet (600 mg total) by mouth every 6 (six) hours as needed. 01/08/20   Fayrene Helper, PA-C  medroxyPROGESTERone (DEPO-PROVERA) 150 MG/ML injection Inject into the muscle.    [provider]  prazosin (MINIPRESS) 2 MG capsule Take 1 capsule by mouth at bedtime 06/26/22   Allwardt, Alyssa M, PA-C  QUEtiapine (SEROQUEL) 100 MG tablet TAKE 1 TABLET BY MOUTH AT BEDTIME 06/26/22   Allwardt, Alyssa M, PA-C  traZODone (DESYREL) 50 MG tablet TAKE 1/2 TO 1 (ONE-HALF TO ONE) TABLET BY MOUTH AT BEDTIME AS NEEDED FOR SLEEP 07/24/22   Allwardt, Crist Infante, PA-C      Allergies    Latex    Review of Systems   Review of Systems  Physical Exam Updated Vital Signs BP 131/87   Pulse 96   Temp 98.7 F (37.1 C) (Oral)   Resp 20   Ht 5\' 2"  (1.575 m)   Wt 65.8 kg   SpO2 99%   BMI 26.52 kg/m  Physical Exam  ED Results / Procedures / Treatments   Labs (all labs ordered are listed, but only abnormal results are displayed) Labs Reviewed - No data to display  EKG None  Radiology DG Hand 2 View Left  Result Date: 07/24/2022 CLINICAL DATA:  Laceration EXAM: LEFT HAND - 2 VIEW COMPARISON:  None Available. FINDINGS: No fracture. Hyper extended appearance of the third digit. No radiopaque foreign body. IMPRESSION: No acute osseous abnormality or radiopaque foreign body. Electronically Signed   By: Jasmine Pang M.D.   On: 07/24/2022 21:03    Procedures Procedures  {Document cardiac monitor,  telemetry assessment procedure when appropriate:1}  Medications Ordered in ED Medications - No data to display  ED Course/ Medical Decision Making/ A&P   {   Click here for ABCD2, HEART and other calculatorsREFRESH Note before signing :1}                          Medical Decision Making Amount and/or Complexity of Data Reviewed Radiology: ordered.   ***  {Document critical care time when appropriate:1} {Document review of labs and clinical decision tools ie heart score, Chads2Vasc2 etc:1}  {Document your independent review of radiology images, and any outside records:1} {Document your discussion with family members, caretakers, and with consultants:1} {Document social determinants of health affecting pt's care:1} {Document your decision making why or why not admission, treatments were needed:1} Final Clinical Impression(s) / ED Diagnoses Final diagnoses:  None    Rx / DC Orders ED Discharge Orders     None

## 2022-07-26 ENCOUNTER — Other Ambulatory Visit: Payer: Self-pay | Admitting: Physician Assistant

## 2022-07-28 ENCOUNTER — Ambulatory Visit: Payer: No Typology Code available for payment source | Admitting: Orthopedic Surgery

## 2022-07-31 DIAGNOSIS — S61412A Laceration without foreign body of left hand, initial encounter: Secondary | ICD-10-CM | POA: Insufficient documentation

## 2022-07-31 DIAGNOSIS — S66802A Unspecified injury of other specified muscles, fascia and tendons at wrist and hand level, left hand, initial encounter: Secondary | ICD-10-CM | POA: Insufficient documentation

## 2022-07-31 DIAGNOSIS — M79642 Pain in left hand: Secondary | ICD-10-CM | POA: Insufficient documentation

## 2022-08-24 ENCOUNTER — Other Ambulatory Visit: Payer: Self-pay | Admitting: Physician Assistant

## 2022-08-25 DIAGNOSIS — S66123D Laceration of flexor muscle, fascia and tendon of left middle finger at wrist and hand level, subsequent encounter: Secondary | ICD-10-CM | POA: Diagnosis not present

## 2022-08-25 DIAGNOSIS — Z9889 Other specified postprocedural states: Secondary | ICD-10-CM | POA: Diagnosis not present

## 2022-08-25 DIAGNOSIS — R6889 Other general symptoms and signs: Secondary | ICD-10-CM | POA: Diagnosis not present

## 2022-08-25 DIAGNOSIS — M79642 Pain in left hand: Secondary | ICD-10-CM | POA: Diagnosis not present

## 2022-08-25 DIAGNOSIS — R2232 Localized swelling, mass and lump, left upper limb: Secondary | ICD-10-CM | POA: Diagnosis not present

## 2022-09-01 DIAGNOSIS — Z9889 Other specified postprocedural states: Secondary | ICD-10-CM | POA: Diagnosis not present

## 2022-09-01 DIAGNOSIS — R6889 Other general symptoms and signs: Secondary | ICD-10-CM | POA: Diagnosis not present

## 2022-09-01 DIAGNOSIS — S66123D Laceration of flexor muscle, fascia and tendon of left middle finger at wrist and hand level, subsequent encounter: Secondary | ICD-10-CM | POA: Diagnosis not present

## 2022-09-01 DIAGNOSIS — R2232 Localized swelling, mass and lump, left upper limb: Secondary | ICD-10-CM | POA: Diagnosis not present

## 2022-09-01 DIAGNOSIS — M79642 Pain in left hand: Secondary | ICD-10-CM | POA: Diagnosis not present

## 2022-09-08 DIAGNOSIS — R2232 Localized swelling, mass and lump, left upper limb: Secondary | ICD-10-CM | POA: Diagnosis not present

## 2022-09-08 DIAGNOSIS — S66123D Laceration of flexor muscle, fascia and tendon of left middle finger at wrist and hand level, subsequent encounter: Secondary | ICD-10-CM | POA: Diagnosis not present

## 2022-09-08 DIAGNOSIS — Z9889 Other specified postprocedural states: Secondary | ICD-10-CM | POA: Diagnosis not present

## 2022-09-08 DIAGNOSIS — M79642 Pain in left hand: Secondary | ICD-10-CM | POA: Diagnosis not present

## 2022-09-08 DIAGNOSIS — R6889 Other general symptoms and signs: Secondary | ICD-10-CM | POA: Diagnosis not present

## 2022-09-15 DIAGNOSIS — Z9889 Other specified postprocedural states: Secondary | ICD-10-CM | POA: Diagnosis not present

## 2022-09-15 DIAGNOSIS — M79642 Pain in left hand: Secondary | ICD-10-CM | POA: Diagnosis not present

## 2022-09-15 DIAGNOSIS — R6889 Other general symptoms and signs: Secondary | ICD-10-CM | POA: Diagnosis not present

## 2022-09-15 DIAGNOSIS — R2232 Localized swelling, mass and lump, left upper limb: Secondary | ICD-10-CM | POA: Diagnosis not present

## 2022-09-15 DIAGNOSIS — S66123D Laceration of flexor muscle, fascia and tendon of left middle finger at wrist and hand level, subsequent encounter: Secondary | ICD-10-CM | POA: Diagnosis not present

## 2022-09-22 DIAGNOSIS — S61412A Laceration without foreign body of left hand, initial encounter: Secondary | ICD-10-CM | POA: Diagnosis not present

## 2022-09-22 DIAGNOSIS — R2232 Localized swelling, mass and lump, left upper limb: Secondary | ICD-10-CM | POA: Diagnosis not present

## 2022-09-22 DIAGNOSIS — Z9889 Other specified postprocedural states: Secondary | ICD-10-CM | POA: Diagnosis not present

## 2022-09-22 DIAGNOSIS — S66123D Laceration of flexor muscle, fascia and tendon of left middle finger at wrist and hand level, subsequent encounter: Secondary | ICD-10-CM | POA: Diagnosis not present

## 2022-09-22 DIAGNOSIS — S66802A Unspecified injury of other specified muscles, fascia and tendons at wrist and hand level, left hand, initial encounter: Secondary | ICD-10-CM | POA: Diagnosis not present

## 2022-09-22 DIAGNOSIS — R6889 Other general symptoms and signs: Secondary | ICD-10-CM | POA: Diagnosis not present

## 2022-09-22 DIAGNOSIS — S66922A Laceration of unspecified muscle, fascia and tendon at wrist and hand level, left hand, initial encounter: Secondary | ICD-10-CM | POA: Diagnosis not present

## 2022-09-22 DIAGNOSIS — M79642 Pain in left hand: Secondary | ICD-10-CM | POA: Diagnosis not present

## 2022-09-23 ENCOUNTER — Other Ambulatory Visit: Payer: Self-pay | Admitting: Physician Assistant

## 2022-09-29 DIAGNOSIS — S61412A Laceration without foreign body of left hand, initial encounter: Secondary | ICD-10-CM | POA: Diagnosis not present

## 2022-09-29 DIAGNOSIS — Z9889 Other specified postprocedural states: Secondary | ICD-10-CM | POA: Diagnosis not present

## 2022-09-29 DIAGNOSIS — S66123D Laceration of flexor muscle, fascia and tendon of left middle finger at wrist and hand level, subsequent encounter: Secondary | ICD-10-CM | POA: Diagnosis not present

## 2022-09-29 DIAGNOSIS — S66802A Unspecified injury of other specified muscles, fascia and tendons at wrist and hand level, left hand, initial encounter: Secondary | ICD-10-CM | POA: Diagnosis not present

## 2022-09-29 DIAGNOSIS — R2232 Localized swelling, mass and lump, left upper limb: Secondary | ICD-10-CM | POA: Diagnosis not present

## 2022-09-29 DIAGNOSIS — S66922A Laceration of unspecified muscle, fascia and tendon at wrist and hand level, left hand, initial encounter: Secondary | ICD-10-CM | POA: Diagnosis not present

## 2022-09-29 DIAGNOSIS — R6889 Other general symptoms and signs: Secondary | ICD-10-CM | POA: Diagnosis not present

## 2022-09-29 DIAGNOSIS — M79642 Pain in left hand: Secondary | ICD-10-CM | POA: Diagnosis not present

## 2022-10-14 ENCOUNTER — Ambulatory Visit: Payer: No Typology Code available for payment source

## 2022-10-15 ENCOUNTER — Ambulatory Visit (INDEPENDENT_AMBULATORY_CARE_PROVIDER_SITE_OTHER): Payer: No Typology Code available for payment source

## 2022-10-15 DIAGNOSIS — Z3042 Encounter for surveillance of injectable contraceptive: Secondary | ICD-10-CM

## 2022-10-15 MED ORDER — MEDROXYPROGESTERONE ACETATE 150 MG/ML IM SUSP
150.0000 mg | Freq: Once | INTRAMUSCULAR | Status: AC
Start: 2022-10-15 — End: 2022-10-15
  Administered 2022-10-15: 150 mg via INTRAMUSCULAR

## 2022-10-15 NOTE — Progress Notes (Signed)
Pt came in on nurse visit to receive Depo injection. Administered in Lt ventrolateral. Pt tolerated well. Pt was advised to come back in 3 mos for next injection.

## 2022-10-22 ENCOUNTER — Other Ambulatory Visit: Payer: Self-pay | Admitting: Physician Assistant

## 2022-10-22 DIAGNOSIS — F411 Generalized anxiety disorder: Secondary | ICD-10-CM | POA: Diagnosis not present

## 2022-10-27 ENCOUNTER — Ambulatory Visit (INDEPENDENT_AMBULATORY_CARE_PROVIDER_SITE_OTHER): Payer: 59 | Admitting: Physician Assistant

## 2022-10-27 ENCOUNTER — Encounter: Payer: Self-pay | Admitting: Physician Assistant

## 2022-10-27 VITALS — BP 106/70 | HR 103 | Temp 98.0°F | Ht 62.0 in | Wt 131.8 lb

## 2022-10-27 DIAGNOSIS — F32A Depression, unspecified: Secondary | ICD-10-CM | POA: Diagnosis not present

## 2022-10-27 DIAGNOSIS — F419 Anxiety disorder, unspecified: Secondary | ICD-10-CM

## 2022-10-27 DIAGNOSIS — F4310 Post-traumatic stress disorder, unspecified: Secondary | ICD-10-CM | POA: Diagnosis not present

## 2022-10-27 DIAGNOSIS — Z23 Encounter for immunization: Secondary | ICD-10-CM | POA: Diagnosis not present

## 2022-10-27 DIAGNOSIS — G479 Sleep disorder, unspecified: Secondary | ICD-10-CM | POA: Diagnosis not present

## 2022-10-27 DIAGNOSIS — M79642 Pain in left hand: Secondary | ICD-10-CM

## 2022-10-27 NOTE — Progress Notes (Unsigned)
Subjective:    Patient ID: Veronica Best, female    DOB: 08-22-2000, 22 y.o.   MRN: [id number]  Chief Complaint  Patient presents with   Medical Management of Chronic Issues    Pt in office for 6 mon f/u and med check; pt states no concerns to discuss;     HPI Patient is in today for 6 month med check. See A/P for details. Interim history - surgery L hand tendon repair after accidental laceration in April 2024. Currently in extensive hand PT. (L hand dominant).   Past Medical History:  Diagnosis Date   Anxiety    Hypoglycemia    PTSD (post-traumatic stress disorder)     Past Surgical History:  Procedure Laterality Date   HAND TENDON SURGERY Left 07/2022   [facility]   tubes in ears     WRIST SURGERY Right     Family History  Problem Relation Age of Onset   Bipolar disorder Sister    Breast cancer Maternal Grandmother        great grandmother   Cancer Neg Hx     Social History   Tobacco Use   Smoking status: Never   Smokeless tobacco: Never  Vaping Use   Vaping Use: Never used  Substance Use Topics   Alcohol use: Yes    Alcohol/week: 1.0 standard drink of alcohol    Types: 1 Glasses of wine per week   Drug use: No     Allergies  Allergen Reactions   Latex Itching    Review of Systems NEGATIVE UNLESS OTHERWISE INDICATED IN HPI      Objective:     BP 106/70 (BP Location: Left Arm)   Pulse (!) 103   Temp 98 F (36.7 C) (Temporal)   Ht 5\' 2"  (1.575 m)   Wt 131 lb 12.8 oz (59.8 kg)   SpO2 97%   BMI 24.11 kg/m   Wt Readings from Last 3 Encounters:  10/27/22 131 lb 12.8 oz (59.8 kg)  07/24/22 145 lb (65.8 kg)  04/28/22 137 lb (62.1 kg)    BP Readings from Last 3 Encounters:  10/27/22 106/70  07/24/22 131/87  04/28/22 110/74     Physical Exam Vitals and nursing note reviewed.  Constitutional:      Appearance: Normal appearance.  Eyes:     Extraocular Movements: Extraocular movements intact.     Conjunctiva/sclera: Conjunctivae normal.      Pupils: Pupils are equal, round, and reactive to light.  Cardiovascular:     Rate and Rhythm: Normal rate and regular rhythm.     Heart sounds: No murmur heard. Pulmonary:     Effort: Pulmonary effort is normal.     Breath sounds: Normal breath sounds.  Skin:    Comments: Scar left hand  Neurological:     General: No focal deficit present.     Mental Status: She is alert and oriented to person, place, and time.  Psychiatric:        Mood and Affect: Mood normal.        Behavior: Behavior normal.        Assessment & Plan:  Anxiety and depression Assessment & Plan: Still going through therapy every two weeks.  Not following with psychiatrist at this time. Referral sent today.  Doing ok during the daytime, staying busy, moods have been ok.   Orders: -     Ambulatory referral to Psychiatry  PTSD (post-traumatic stress disorder) -     Ambulatory referral to  Psychiatry  Difficulty sleeping Assessment & Plan: No longer taking any Benadryl. Sleep is still restless.  Continue on Minipress 2 mg at bedtime Seroquel 100 mg at bedtime  Trazodone 50 mg at bedtime prn  Refer to psychiatry for additional help   Orders: -     Ambulatory referral to Psychiatry  Left hand pain  Immunization due -     HPV 9-valent vaccine,Recombinat       Return in about 6 months (around 04/29/2023) for physical and med check .     Naelle Diegel M Loran Auguste, PA-C

## 2022-10-28 NOTE — Assessment & Plan Note (Signed)
No longer taking any Benadryl. Sleep is still restless.  Continue on Minipress 2 mg at bedtime Seroquel 100 mg at bedtime  Trazodone 50 mg at bedtime prn  Refer to psychiatry for additional help

## 2022-10-28 NOTE — Assessment & Plan Note (Signed)
Still going through therapy every two weeks.  Not following with psychiatrist at this time. Referral sent today.  Doing ok during the daytime, staying busy, moods have been ok.

## 2022-11-04 DIAGNOSIS — S66922S Laceration of unspecified muscle, fascia and tendon at wrist and hand level, left hand, sequela: Secondary | ICD-10-CM | POA: Diagnosis not present

## 2022-11-04 DIAGNOSIS — M25642 Stiffness of left hand, not elsewhere classified: Secondary | ICD-10-CM | POA: Diagnosis not present

## 2022-11-04 DIAGNOSIS — S66802S Unspecified injury of other specified muscles, fascia and tendons at wrist and hand level, left hand, sequela: Secondary | ICD-10-CM | POA: Diagnosis not present

## 2022-11-04 DIAGNOSIS — S61412S Laceration without foreign body of left hand, sequela: Secondary | ICD-10-CM | POA: Diagnosis not present

## 2022-11-10 DIAGNOSIS — S66802S Unspecified injury of other specified muscles, fascia and tendons at wrist and hand level, left hand, sequela: Secondary | ICD-10-CM | POA: Diagnosis not present

## 2022-11-10 DIAGNOSIS — S61412S Laceration without foreign body of left hand, sequela: Secondary | ICD-10-CM | POA: Diagnosis not present

## 2022-11-10 DIAGNOSIS — M25642 Stiffness of left hand, not elsewhere classified: Secondary | ICD-10-CM | POA: Diagnosis not present

## 2022-11-10 DIAGNOSIS — S66922S Laceration of unspecified muscle, fascia and tendon at wrist and hand level, left hand, sequela: Secondary | ICD-10-CM | POA: Diagnosis not present

## 2022-11-11 DIAGNOSIS — F411 Generalized anxiety disorder: Secondary | ICD-10-CM | POA: Diagnosis not present

## 2022-11-18 DIAGNOSIS — S61412S Laceration without foreign body of left hand, sequela: Secondary | ICD-10-CM | POA: Diagnosis not present

## 2022-11-18 DIAGNOSIS — S66802S Unspecified injury of other specified muscles, fascia and tendons at wrist and hand level, left hand, sequela: Secondary | ICD-10-CM | POA: Diagnosis not present

## 2022-11-18 DIAGNOSIS — S66922S Laceration of unspecified muscle, fascia and tendon at wrist and hand level, left hand, sequela: Secondary | ICD-10-CM | POA: Diagnosis not present

## 2022-11-18 DIAGNOSIS — M25642 Stiffness of left hand, not elsewhere classified: Secondary | ICD-10-CM | POA: Diagnosis not present

## 2022-11-20 ENCOUNTER — Other Ambulatory Visit: Payer: Self-pay | Admitting: Physician Assistant

## 2022-11-23 ENCOUNTER — Other Ambulatory Visit: Payer: Self-pay | Admitting: Physician Assistant

## 2022-11-24 DIAGNOSIS — M25531 Pain in right wrist: Secondary | ICD-10-CM | POA: Diagnosis not present

## 2022-11-24 DIAGNOSIS — M25631 Stiffness of right wrist, not elsewhere classified: Secondary | ICD-10-CM | POA: Diagnosis not present

## 2022-11-24 DIAGNOSIS — S61412S Laceration without foreign body of left hand, sequela: Secondary | ICD-10-CM | POA: Diagnosis not present

## 2022-11-24 DIAGNOSIS — M25642 Stiffness of left hand, not elsewhere classified: Secondary | ICD-10-CM | POA: Diagnosis not present

## 2022-11-24 DIAGNOSIS — S66802S Unspecified injury of other specified muscles, fascia and tendons at wrist and hand level, left hand, sequela: Secondary | ICD-10-CM | POA: Diagnosis not present

## 2022-11-24 DIAGNOSIS — S66922S Laceration of unspecified muscle, fascia and tendon at wrist and hand level, left hand, sequela: Secondary | ICD-10-CM | POA: Diagnosis not present

## 2022-11-25 ENCOUNTER — Ambulatory Visit (HOSPITAL_COMMUNITY): Payer: Self-pay | Admitting: Psychiatry

## 2022-11-25 ENCOUNTER — Other Ambulatory Visit: Payer: Self-pay | Admitting: Physician Assistant

## 2022-11-26 ENCOUNTER — Encounter: Payer: Self-pay | Admitting: Physician Assistant

## 2022-11-29 ENCOUNTER — Other Ambulatory Visit: Payer: Self-pay | Admitting: Physician Assistant

## 2022-12-03 DIAGNOSIS — F411 Generalized anxiety disorder: Secondary | ICD-10-CM | POA: Diagnosis not present

## 2022-12-16 DIAGNOSIS — F411 Generalized anxiety disorder: Secondary | ICD-10-CM | POA: Diagnosis not present

## 2022-12-23 ENCOUNTER — Other Ambulatory Visit: Payer: Self-pay | Admitting: Physician Assistant

## 2023-01-08 ENCOUNTER — Ambulatory Visit (HOSPITAL_COMMUNITY): Payer: Self-pay | Admitting: Psychiatry

## 2023-01-19 ENCOUNTER — Ambulatory Visit (INDEPENDENT_AMBULATORY_CARE_PROVIDER_SITE_OTHER): Payer: No Typology Code available for payment source

## 2023-01-19 DIAGNOSIS — Z3042 Encounter for surveillance of injectable contraceptive: Secondary | ICD-10-CM

## 2023-01-19 DIAGNOSIS — Z3202 Encounter for pregnancy test, result negative: Secondary | ICD-10-CM | POA: Diagnosis not present

## 2023-01-19 LAB — POCT URINE PREGNANCY: Preg Test, Ur: NEGATIVE

## 2023-01-19 MED ORDER — MEDROXYPROGESTERONE ACETATE 150 MG/ML IM SUSP
150.0000 mg | Freq: Once | INTRAMUSCULAR | Status: AC
Start: 2023-01-19 — End: 2023-01-19
  Administered 2023-01-19: 150 mg via INTRAMUSCULAR

## 2023-01-19 NOTE — Progress Notes (Signed)
Pt in the office for Depo Provera injection. Pt last received 6/27 and should have received this injection 9/16-9/27/ Ran POC urine pregnancy and came back negative. Administered Depo-Provera in right hip and pt tolerated well. Advised pt will need to return for next injection 12/17-12/31 for next injection. Pt verbalized understanding.

## 2023-01-20 DIAGNOSIS — F411 Generalized anxiety disorder: Secondary | ICD-10-CM | POA: Diagnosis not present

## 2023-01-23 ENCOUNTER — Other Ambulatory Visit: Payer: Self-pay | Admitting: Physician Assistant

## 2023-01-25 NOTE — Telephone Encounter (Signed)
Please advise last note states only filling Rx's for two month as patient would then be seeing Psych.

## 2023-01-26 ENCOUNTER — Other Ambulatory Visit: Payer: Self-pay

## 2023-01-26 MED ORDER — QUETIAPINE FUMARATE 100 MG PO TABS: 100.0000 mg | ORAL_TABLET | Freq: Every day | ORAL | 0 refills | Status: DC

## 2023-01-26 MED ORDER — PRAZOSIN HCL 2 MG PO CAPS: 2.0000 mg | ORAL_CAPSULE | Freq: Every day | ORAL | 0 refills | Status: DC

## 2023-01-26 NOTE — Telephone Encounter (Signed)
Patient called back and has been scheduled for 10/10 @ 8:30 am. Patient states she believes she is going through withdrawals. Can anything be done until her OV? Please advise.

## 2023-01-28 ENCOUNTER — Ambulatory Visit: Payer: No Typology Code available for payment source | Admitting: Physician Assistant

## 2023-01-28 VITALS — BP 120/80 | HR 103 | Temp 98.0°F | Ht 62.0 in | Wt 141.6 lb

## 2023-01-28 DIAGNOSIS — F419 Anxiety disorder, unspecified: Secondary | ICD-10-CM

## 2023-01-28 DIAGNOSIS — H6122 Impacted cerumen, left ear: Secondary | ICD-10-CM

## 2023-01-28 DIAGNOSIS — F4310 Post-traumatic stress disorder, unspecified: Secondary | ICD-10-CM

## 2023-01-28 DIAGNOSIS — F32A Depression, unspecified: Secondary | ICD-10-CM

## 2023-01-28 DIAGNOSIS — Z23 Encounter for immunization: Secondary | ICD-10-CM

## 2023-01-28 DIAGNOSIS — G479 Sleep disorder, unspecified: Secondary | ICD-10-CM

## 2023-01-28 MED ORDER — PRAZOSIN HCL 2 MG PO CAPS
2.0000 mg | ORAL_CAPSULE | Freq: Every day | ORAL | 0 refills | Status: DC
Start: 2023-01-28 — End: 2023-02-15

## 2023-01-28 MED ORDER — QUETIAPINE FUMARATE 100 MG PO TABS
100.0000 mg | ORAL_TABLET | Freq: Every day | ORAL | 0 refills | Status: DC
Start: 2023-01-28 — End: 2023-02-15

## 2023-01-28 NOTE — Assessment & Plan Note (Signed)
Still going through therapy every two weeks.  Doing ok during the daytime, staying busy, moods have been ok.  02/07/2023 is psychiatry appointment.

## 2023-01-28 NOTE — Assessment & Plan Note (Addendum)
Dreams have been "wonky & vivid" but not terrible. Additional stress this past few months that are driving difficulty falling asleep. Averaging about 5-6 restless hours per night.   Minipress 2 mg at bedtime Seroquel 100 mg at bedtime Trazodone 50 mg at bedtime  02/07/2023 is psychiatry appointment.

## 2023-01-28 NOTE — Progress Notes (Signed)
Subjective:    Patient ID: Veronica Best, female    DOB: 2000-09-08, 22 y.o.   MRN: [id number]  Chief Complaint  Patient presents with   Medication Refill    Pt in office for medication refills and follow up; no concerns to discuss per patient    Medication Refill   Patient is in today for f/up from 10/27/22. See A/P.   Past Medical History:  Diagnosis Date   Anxiety    Hypoglycemia    PTSD (post-traumatic stress disorder)     Past Surgical History:  Procedure Laterality Date   HAND TENDON SURGERY Left 07/2022   [facility]   tubes in ears     WRIST SURGERY Right     Family History  Problem Relation Age of Onset   Bipolar disorder Sister    Breast cancer Maternal Grandmother        great grandmother   Cancer Neg Hx     Social History   Tobacco Use   Smoking status: Never   Smokeless tobacco: Never  Vaping Use   Vaping status: Never Used  Substance Use Topics   Alcohol use: Yes    Alcohol/week: 1.0 standard drink of alcohol    Types: 1 Glasses of wine per week   Drug use: No     Allergies  Allergen Reactions   Latex Itching    Review of Systems NEGATIVE UNLESS OTHERWISE INDICATED IN HPI      Objective:     BP 120/80 (BP Location: Left Arm)   Pulse (!) 103   Temp 98 F (36.7 C) (Temporal)   Ht 5\' 2"  (1.575 m)   Wt 141 lb 9.6 oz (64.2 kg)   SpO2 99%   BMI 25.90 kg/m   Wt Readings from Last 3 Encounters:  01/28/23 141 lb 9.6 oz (64.2 kg)  10/27/22 131 lb 12.8 oz (59.8 kg)  07/24/22 145 lb (65.8 kg)    BP Readings from Last 3 Encounters:  01/28/23 120/80  10/27/22 106/70  07/24/22 131/87     Physical Exam Vitals and nursing note reviewed.  Constitutional:      Appearance: Normal appearance.  HENT:     Right Ear: Tympanic membrane, ear canal and external ear normal. There is no impacted cerumen.     Left Ear: There is impacted cerumen.  Eyes:     Extraocular Movements: Extraocular movements intact.     Conjunctiva/sclera:  Conjunctivae normal.     Pupils: Pupils are equal, round, and reactive to light.  Cardiovascular:     Rate and Rhythm: Normal rate and regular rhythm.     Pulses: Normal pulses.  Pulmonary:     Effort: Pulmonary effort is normal.     Breath sounds: Normal breath sounds.  Neurological:     Mental Status: She is alert.  Psychiatric:        Mood and Affect: Mood normal.        Assessment & Plan:  Anxiety and depression Assessment & Plan: Still going through therapy every two weeks.  Doing ok during the daytime, staying busy, moods have been ok.  02/07/2023 is psychiatry appointment.   PTSD (post-traumatic stress disorder)  Difficulty sleeping Assessment & Plan: Dreams have been "wonky & vivid" but not terrible. Additional stress this past few months that are driving difficulty falling asleep. Averaging about 5-6 restless hours per night.   Minipress 2 mg at bedtime Seroquel 100 mg at bedtime Trazodone 50 mg at bedtime  02/07/2023 is psychiatry  appointment.  Orders: -     QUEtiapine Fumarate; Take 1 tablet (100 mg total) by mouth at bedtime.  Dispense: 30 tablet; Refill: 0 -     Prazosin HCl; Take 1 capsule (2 mg total) by mouth at bedtime.  Dispense: 30 capsule; Refill: 0  Immunization due -     Flu vaccine trivalent PF, 6mos and older(Flulaval,Afluria,Fluarix,Fluzone)  Left ear impacted cerumen   L ear cerumen procedure:  Ceruminosis is noted. Removal procedure explained and verbal consent obtained from patient. Wax is removed by syringing and manual debridement from L ear canal. Pt tolerated procedure well. Instructions for home care to prevent wax buildup are given.     Return if symptoms worsen or fail to improve.    Royanne Warshaw M Ori Kreiter, PA-C

## 2023-02-03 DIAGNOSIS — F411 Generalized anxiety disorder: Secondary | ICD-10-CM | POA: Diagnosis not present

## 2023-02-15 ENCOUNTER — Telehealth (HOSPITAL_BASED_OUTPATIENT_CLINIC_OR_DEPARTMENT_OTHER): Payer: Self-pay | Admitting: Psychiatry

## 2023-02-15 ENCOUNTER — Encounter (HOSPITAL_COMMUNITY): Payer: Self-pay | Admitting: Psychiatry

## 2023-02-15 VITALS — Wt 141.0 lb

## 2023-02-15 DIAGNOSIS — F4310 Post-traumatic stress disorder, unspecified: Secondary | ICD-10-CM

## 2023-02-15 DIAGNOSIS — F319 Bipolar disorder, unspecified: Secondary | ICD-10-CM

## 2023-02-15 DIAGNOSIS — F419 Anxiety disorder, unspecified: Secondary | ICD-10-CM

## 2023-02-15 MED ORDER — PRAZOSIN HCL 1 MG PO CAPS: 1.0000 mg | ORAL_CAPSULE | Freq: Every day | ORAL | 1 refills | Status: DC

## 2023-02-15 MED ORDER — GABAPENTIN 100 MG PO CAPS: 100.0000 mg | ORAL_CAPSULE | Freq: Every day | ORAL | 1 refills | Status: DC

## 2023-02-15 MED ORDER — QUETIAPINE FUMARATE 100 MG PO TABS: 100.0000 mg | ORAL_TABLET | Freq: Every day | ORAL | 1 refills | Status: DC
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
## 2023-02-17 DIAGNOSIS — F411 Generalized anxiety disorder: Secondary | ICD-10-CM | POA: Diagnosis not present

## 2023-02-18 ENCOUNTER — Telehealth: Payer: Self-pay | Admitting: Physician Assistant

## 2023-02-18 NOTE — Telephone Encounter (Signed)
Please see pt msg and advise 

## 2023-02-18 NOTE — Telephone Encounter (Signed)
Pt states her sister has [place] and pt wanted to know if she is ok to be around her or does she needs a booster. Please advise.

## 2023-02-20 ENCOUNTER — Other Ambulatory Visit: Payer: Self-pay | Admitting: Physician Assistant

## 2023-03-03 DIAGNOSIS — F411 Generalized anxiety disorder: Secondary | ICD-10-CM | POA: Diagnosis not present

## 2023-03-17 DIAGNOSIS — F411 Generalized anxiety disorder: Secondary | ICD-10-CM | POA: Diagnosis not present

## 2023-03-22 ENCOUNTER — Encounter (HOSPITAL_COMMUNITY): Payer: Self-pay | Admitting: Psychiatry

## 2023-03-22 ENCOUNTER — Telehealth (HOSPITAL_BASED_OUTPATIENT_CLINIC_OR_DEPARTMENT_OTHER): Payer: No Typology Code available for payment source | Admitting: Psychiatry

## 2023-03-22 VITALS — Wt 141.0 lb

## 2023-03-22 DIAGNOSIS — F4310 Post-traumatic stress disorder, unspecified: Secondary | ICD-10-CM | POA: Diagnosis not present

## 2023-03-22 DIAGNOSIS — F419 Anxiety disorder, unspecified: Secondary | ICD-10-CM

## 2023-03-22 DIAGNOSIS — F319 Bipolar disorder, unspecified: Secondary | ICD-10-CM | POA: Diagnosis not present

## 2023-03-22 MED ORDER — GABAPENTIN 100 MG PO CAPS: 200.0000 mg | ORAL_CAPSULE | Freq: Every day | ORAL | 1 refills | Status: DC

## 2023-03-22 MED ORDER — TRAZODONE HCL 50 MG PO TABS: 50.0000 mg | ORAL_TABLET | Freq: Every evening | ORAL | 1 refills | Status: DC | PRN

## 2023-03-22 MED ORDER — QUETIAPINE FUMARATE 100 MG PO TABS: 100.0000 mg | ORAL_TABLET | Freq: Every day | ORAL | 1 refills | Status: DC
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
## 2023-04-01 DIAGNOSIS — F411 Generalized anxiety disorder: Secondary | ICD-10-CM | POA: Diagnosis not present

## 2023-04-08 ENCOUNTER — Ambulatory Visit: Payer: No Typology Code available for payment source | Admitting: *Deleted

## 2023-04-08 DIAGNOSIS — Z3042 Encounter for surveillance of injectable contraceptive: Secondary | ICD-10-CM | POA: Diagnosis not present

## 2023-04-08 MED ORDER — MEDROXYPROGESTERONE ACETATE 150 MG/ML IM SUSP
150.0000 mg | Freq: Once | INTRAMUSCULAR | Status: AC
  Administered 2023-04-08: 150 mg via INTRAMUSCULAR

## 2023-04-08 NOTE — Progress Notes (Signed)
Per orders of Alyssa Allwardt, PA-C, injection of Depo-Provera 150 mg given in right  ventrogluteal per patient preference by Jobe Gibbon, CMA. Patient tolerated injection well. Patient reminded to schedule injection, due March 6-20.

## 2023-04-15 DIAGNOSIS — F411 Generalized anxiety disorder: Secondary | ICD-10-CM | POA: Diagnosis not present

## 2023-05-05 DIAGNOSIS — F411 Generalized anxiety disorder: Secondary | ICD-10-CM | POA: Diagnosis not present

## 2023-05-19 DIAGNOSIS — F411 Generalized anxiety disorder: Secondary | ICD-10-CM | POA: Diagnosis not present

## 2023-05-25 ENCOUNTER — Encounter (HOSPITAL_COMMUNITY): Payer: Self-pay | Admitting: Psychiatry

## 2023-05-25 ENCOUNTER — Telehealth (HOSPITAL_BASED_OUTPATIENT_CLINIC_OR_DEPARTMENT_OTHER): Payer: Self-pay | Admitting: Psychiatry

## 2023-05-25 VITALS — Wt 141.0 lb

## 2023-05-25 DIAGNOSIS — F419 Anxiety disorder, unspecified: Secondary | ICD-10-CM

## 2023-05-25 DIAGNOSIS — F319 Bipolar disorder, unspecified: Secondary | ICD-10-CM

## 2023-05-25 DIAGNOSIS — F4310 Post-traumatic stress disorder, unspecified: Secondary | ICD-10-CM

## 2023-05-25 MED ORDER — GABAPENTIN 100 MG PO CAPS: 100.0000 mg | ORAL_CAPSULE | Freq: Every day | ORAL | 0 refills | Status: DC

## 2023-05-25 MED ORDER — QUETIAPINE FUMARATE 100 MG PO TABS: 100.0000 mg | ORAL_TABLET | Freq: Every day | ORAL | 0 refills | Status: DC

## 2023-05-25 MED ORDER — SERTRALINE HCL 25 MG PO TABS: 25.0000 mg | ORAL_TABLET | Freq: Every day | ORAL | 0 refills | Status: DC
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
## 2023-06-03 DIAGNOSIS — F411 Generalized anxiety disorder: Secondary | ICD-10-CM | POA: Diagnosis not present

## 2023-06-15 DIAGNOSIS — F411 Generalized anxiety disorder: Secondary | ICD-10-CM | POA: Diagnosis not present

## 2023-06-22 ENCOUNTER — Telehealth (HOSPITAL_BASED_OUTPATIENT_CLINIC_OR_DEPARTMENT_OTHER): Payer: No Typology Code available for payment source | Admitting: Psychiatry

## 2023-06-22 ENCOUNTER — Encounter (HOSPITAL_COMMUNITY): Payer: Self-pay | Admitting: Psychiatry

## 2023-06-22 DIAGNOSIS — F419 Anxiety disorder, unspecified: Secondary | ICD-10-CM

## 2023-06-22 DIAGNOSIS — F4310 Post-traumatic stress disorder, unspecified: Secondary | ICD-10-CM | POA: Diagnosis not present

## 2023-06-22 DIAGNOSIS — F319 Bipolar disorder, unspecified: Secondary | ICD-10-CM

## 2023-06-22 MED ORDER — GABAPENTIN 100 MG PO CAPS: 100.0000 mg | ORAL_CAPSULE | Freq: Every day | ORAL | 2 refills | Status: DC

## 2023-06-22 MED ORDER — QUETIAPINE FUMARATE 100 MG PO TABS: 100.0000 mg | ORAL_TABLET | Freq: Every day | ORAL | 2 refills | Status: DC

## 2023-06-22 MED ORDER — SERTRALINE HCL 25 MG PO TABS: 25.0000 mg | ORAL_TABLET | Freq: Every day | ORAL | 2 refills | Status: DC
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
## 2023-06-29 DIAGNOSIS — F411 Generalized anxiety disorder: Secondary | ICD-10-CM | POA: Diagnosis not present

## 2023-07-06 ENCOUNTER — Ambulatory Visit (INDEPENDENT_AMBULATORY_CARE_PROVIDER_SITE_OTHER): Payer: No Typology Code available for payment source

## 2023-07-06 DIAGNOSIS — Z3042 Encounter for surveillance of injectable contraceptive: Secondary | ICD-10-CM | POA: Diagnosis not present

## 2023-07-06 MED ORDER — MEDROXYPROGESTERONE ACETATE 150 MG/ML IM SUSP
150.0000 mg | Freq: Once | INTRAMUSCULAR | Status: AC
  Administered 2023-07-06: 150 mg via INTRAMUSCULAR

## 2023-07-06 NOTE — Progress Notes (Signed)
 Patient is in office today for a nurse visit for  Depo-Provera , per PCP's order. Patient Injection was given in the  Right deltoid. Patient tolerated injection well.

## 2023-07-13 DIAGNOSIS — F411 Generalized anxiety disorder: Secondary | ICD-10-CM | POA: Diagnosis not present

## 2023-07-27 ENCOUNTER — Encounter: Payer: Self-pay | Admitting: Physician Assistant

## 2023-07-27 NOTE — Telephone Encounter (Signed)
 Form printed last CPE January 2024, please advise on completion of form or patient needing to have CPE completed first

## 2023-07-29 ENCOUNTER — Ambulatory Visit (INDEPENDENT_AMBULATORY_CARE_PROVIDER_SITE_OTHER): Admitting: Physician Assistant

## 2023-07-29 ENCOUNTER — Encounter: Payer: Self-pay | Admitting: Physician Assistant

## 2023-07-29 ENCOUNTER — Ambulatory Visit: Admitting: Physician Assistant

## 2023-07-29 VITALS — BP 114/72 | HR 67 | Temp 98.4°F | Ht 62.0 in | Wt 149.2 lb

## 2023-07-29 DIAGNOSIS — Z111 Encounter for screening for respiratory tuberculosis: Secondary | ICD-10-CM

## 2023-07-29 DIAGNOSIS — L237 Allergic contact dermatitis due to plants, except food: Secondary | ICD-10-CM

## 2023-07-29 DIAGNOSIS — Z0000 Encounter for general adult medical examination without abnormal findings: Secondary | ICD-10-CM

## 2023-07-29 MED ORDER — BETAMETHASONE DIPROPIONATE 0.05 % EX CREA: TOPICAL_CREAM | Freq: Two times a day (BID) | CUTANEOUS | 0 refills | Status: DC

## 2023-07-29 NOTE — Progress Notes (Signed)
 Patient ID: Veronica Best, female    DOB: Jun 26, 2000, 23 y.o.   MRN: [id number]   Assessment & Plan:  Annual physical exam -     QuantiFERON-TB Gold Plus  Tuberculosis screening -     QuantiFERON-TB Gold Plus  Poison ivy dermatitis -     Betamethasone Dipropionate; Apply topically 2 (two) times daily. Apply thin layer to affected areas from neck down no longer than 2 weeks consistent use.  Dispense: 45 g; Refill: 0   Assessment & Plan Poison Ivy Dermatitis Rash on the knee extending to the foot and toe, consistent with poison ivy dermatitis, causing significant itching and discomfort. Over-the-counter cream has been insufficient. - Prescribe betamethasone cream for topical application to alleviate itching and inflammation. Advise to contact if the cost is prohibitive due to insurance issues.  Earwax Buildup Sensitivity and partial blockage in the ear due to earwax. Manual removal is not recommended due to sensitivity. - Recommend Debrox ear drops to soften and facilitate earwax drainage.  General Health Maintenance Immunizations, including COVID-19 vaccine, are up to date. No issues with vision, hearing, heart, lungs, lifting, or carrying. Sexually active and using Depo-Provera for birth control. No current concerns for STDs. Pap smear is up to date. TB Gold blood test is required for employment. - Order TB Gold blood test for tuberculosis screening as part of employment requirements. - Schedule annual physical examination in one year.      Return in about 1 year (around 07/28/2024) for physical.    Subjective:    Chief Complaint  Patient presents with   Annual Exam    Pt in office for annual CPE and fasting labs; pt has form here in the office to be completed and signed by PCP for new position with [employer]. Pt has poison ivy outbreak under right bicep and c/o itching    HPI Discussed the use of AI scribe software for clinical note transcription with  the patient, who gave verbal consent to proceed.  History of Present Illness Veronica Best is a 23 year old female who presents for a routine physical exam and TB test.  She is here for a routine physical exam and to complete a TB test as part of her new job requirements at [employer]. Her immunizations are up to date. No issues with vision, hearing, heart, lungs, lifting, or carrying. She is strong and healthy overall, with no recent skin changes, rashes, or moles. Her Pap smear is up to date.  She has poison ivy dermatitis on her right arm and both ankles. She has been using an over-the-counter cream Calamine, but finds it insufficient for relief.  She is currently on birth control via the Depo shot, which she receives every three months. She is sexually active and confident in her and her partner's STD status. She is not using Minipress anymore but is on Seroquel and gabapentin, which effectively manage her symptoms, including suppressing her dreams. She also takes Zoloft, which helps maintain balance in her medication regimen.  She recently started a new job at [employer], which she is excited about. She previously worked in a more demanding job that she found overwhelming. She is looking forward to the new role, which offers a better work-life balance.     Past Medical History:  Diagnosis Date   Anxiety    Hypoglycemia    PTSD (post-traumatic stress disorder)     Past Surgical History:  Procedure Laterality Date  HAND TENDON SURGERY Left 07/2022   [facility]   tubes in ears     WRIST SURGERY Right     Family History  Problem Relation Age of Onset   Bipolar disorder Sister    Breast cancer Maternal Grandmother        great grandmother   Cancer Neg Hx     Social History   Tobacco Use   Smoking status: Never   Smokeless tobacco: Never  Vaping Use   Vaping status: Never Used  Substance Use Topics   Alcohol use: Yes    Alcohol/week: 1.0 standard  drink of alcohol    Types: 1 Glasses of wine per week   Drug use: No     Allergies  Allergen Reactions   Latex Itching    Review of Systems NEGATIVE UNLESS OTHERWISE INDICATED IN HPI      Objective:     BP 114/72 (BP Location: Left Arm, Patient Position: Sitting, Cuff Size: Normal)   Pulse 67   Temp 98.4 F (36.9 C) (Temporal)   Ht 5\' 2"  (1.575 m)   Wt 149 lb 3.2 oz (67.7 kg)   SpO2 99%   BMI 27.29 kg/m   Wt Readings from Last 3 Encounters:  07/29/23 149 lb 3.2 oz (67.7 kg)  01/28/23 141 lb 9.6 oz (64.2 kg)  10/27/22 131 lb 12.8 oz (59.8 kg)    BP Readings from Last 3 Encounters:  07/29/23 114/72  01/28/23 120/80  10/27/22 106/70     Physical Exam Vitals and nursing note reviewed.  Constitutional:      Appearance: Normal appearance. She is normal weight. She is not toxic-appearing.  HENT:     Head: Normocephalic and atraumatic.     Right Ear: Tympanic membrane, ear canal and external ear normal.     Left Ear: Tympanic membrane, ear canal and external ear normal.     Ears:     Comments: Minimal wax noted bilateral ears    Nose: Nose normal.     Mouth/Throat:     Mouth: Mucous membranes are moist.  Eyes:     Extraocular Movements: Extraocular movements intact.     Conjunctiva/sclera: Conjunctivae normal.     Pupils: Pupils are equal, round, and reactive to light.  Cardiovascular:     Rate and Rhythm: Normal rate and regular rhythm.     Pulses: Normal pulses.     Heart sounds: Normal heart sounds.  Pulmonary:     Effort: Pulmonary effort is normal.     Breath sounds: Normal breath sounds.  Abdominal:     General: Abdomen is flat. Bowel sounds are normal.     Palpations: Abdomen is soft.  Musculoskeletal:        General: Normal range of motion.     Cervical back: Normal range of motion and neck supple.  Skin:    General: Skin is warm and dry.     Findings: Rash (poison ivy dermatitis noted posterior R upper arm and bilateral ankles; mild in nature)  present.  Neurological:     General: No focal deficit present.     Mental Status: She is alert and oriented to person, place, and time.  Psychiatric:        Mood and Affect: Mood normal.        Behavior: Behavior normal.        Thought Content: Thought content normal.        Judgment: Judgment normal.  Isamar Wellbrock M Aiyla Baucom, PA-C

## 2023-08-01 LAB — QUANTIFERON-TB GOLD PLUS
Mitogen-NIL: >10 [IU]/mL
NIL: 0.03 [IU]/mL
QuantiFERON-TB Gold Plus: NEGATIVE
TB1-NIL: 0 [IU]/mL
TB2-NIL: 0.02 [IU]/mL

## 2023-08-02 ENCOUNTER — Encounter: Payer: Self-pay | Admitting: Physician Assistant

## 2023-08-18 DIAGNOSIS — F411 Generalized anxiety disorder: Secondary | ICD-10-CM | POA: Diagnosis not present

## 2023-09-16 ENCOUNTER — Other Ambulatory Visit (HOSPITAL_COMMUNITY): Payer: Self-pay | Admitting: Psychiatry

## 2023-09-16 DIAGNOSIS — F419 Anxiety disorder, unspecified: Secondary | ICD-10-CM

## 2023-09-21 DIAGNOSIS — F411 Generalized anxiety disorder: Secondary | ICD-10-CM | POA: Diagnosis not present

## 2023-09-22 ENCOUNTER — Telehealth (HOSPITAL_COMMUNITY): Admitting: Psychiatry

## 2023-09-28 ENCOUNTER — Ambulatory Visit (INDEPENDENT_AMBULATORY_CARE_PROVIDER_SITE_OTHER): Admitting: *Deleted

## 2023-09-28 DIAGNOSIS — Z789 Other specified health status: Secondary | ICD-10-CM

## 2023-09-28 MED ORDER — MEDROXYPROGESTERONE ACETATE 150 MG/ML IM SUSP
150.0000 mg | Freq: Once | INTRAMUSCULAR | Status: AC
  Administered 2023-09-28: 150 mg via INTRAMUSCULAR

## 2023-09-28 NOTE — Progress Notes (Signed)
 Patient present for Depo Provera   Given IM Rt Hip Patient tolerated well  Advise to return by Aug 26-Sep9 Verbalized understanding

## 2023-10-12 DIAGNOSIS — F411 Generalized anxiety disorder: Secondary | ICD-10-CM | POA: Diagnosis not present

## 2023-10-18 ENCOUNTER — Encounter (HOSPITAL_COMMUNITY): Payer: Self-pay | Admitting: Psychiatry

## 2023-10-18 ENCOUNTER — Telehealth (HOSPITAL_BASED_OUTPATIENT_CLINIC_OR_DEPARTMENT_OTHER): Admitting: Psychiatry

## 2023-10-18 DIAGNOSIS — F4310 Post-traumatic stress disorder, unspecified: Secondary | ICD-10-CM | POA: Diagnosis not present

## 2023-10-18 DIAGNOSIS — F319 Bipolar disorder, unspecified: Secondary | ICD-10-CM | POA: Diagnosis not present

## 2023-10-18 DIAGNOSIS — F419 Anxiety disorder, unspecified: Secondary | ICD-10-CM

## 2023-10-18 MED ORDER — GABAPENTIN 100 MG PO CAPS: 100.0000 mg | ORAL_CAPSULE | Freq: Every day | ORAL | 2 refills | Status: DC

## 2023-10-18 MED ORDER — QUETIAPINE FUMARATE 100 MG PO TABS: 100.0000 mg | ORAL_TABLET | Freq: Every day | ORAL | 2 refills | Status: DC

## 2023-10-18 MED ORDER — SERTRALINE HCL 25 MG PO TABS: 25.0000 mg | ORAL_TABLET | Freq: Every day | ORAL | 2 refills | Status: DC
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
## 2023-12-28 ENCOUNTER — Ambulatory Visit (INDEPENDENT_AMBULATORY_CARE_PROVIDER_SITE_OTHER)

## 2023-12-28 DIAGNOSIS — Z3042 Encounter for surveillance of injectable contraceptive: Secondary | ICD-10-CM

## 2023-12-28 MED ORDER — MEDROXYPROGESTERONE ACETATE 150 MG/ML IM SUSP
150.0000 mg | Freq: Once | INTRAMUSCULAR | Status: AC
  Administered 2023-12-28: 150 mg via INTRAMUSCULAR

## 2023-12-28 NOTE — Progress Notes (Signed)
 Pt came in on the Nurse schedule to receive Depo injection. Injection was administered in the Left thigh per Job without any complaints. Pt was advised to schedule next injection between Nov 25-Dec 9th 4f 2025. Pt understood.

## 2024-01-14 ENCOUNTER — Other Ambulatory Visit (HOSPITAL_COMMUNITY): Payer: Self-pay | Admitting: Psychiatry

## 2024-01-14 DIAGNOSIS — F319 Bipolar disorder, unspecified: Secondary | ICD-10-CM

## 2024-01-14 DIAGNOSIS — F4310 Post-traumatic stress disorder, unspecified: Secondary | ICD-10-CM

## 2024-01-14 DIAGNOSIS — F419 Anxiety disorder, unspecified: Secondary | ICD-10-CM

## 2024-01-17 ENCOUNTER — Telehealth (HOSPITAL_COMMUNITY): Payer: Self-pay | Admitting: Psychiatry

## 2024-01-17 ENCOUNTER — Encounter (HOSPITAL_COMMUNITY): Payer: Self-pay | Admitting: Psychiatry

## 2024-01-17 ENCOUNTER — Other Ambulatory Visit (HOSPITAL_COMMUNITY): Payer: Self-pay | Admitting: *Deleted

## 2024-01-17 VITALS — Wt 154.0 lb

## 2024-01-17 DIAGNOSIS — F4310 Post-traumatic stress disorder, unspecified: Secondary | ICD-10-CM | POA: Diagnosis not present

## 2024-01-17 DIAGNOSIS — F319 Bipolar disorder, unspecified: Secondary | ICD-10-CM | POA: Diagnosis not present

## 2024-01-17 DIAGNOSIS — F419 Anxiety disorder, unspecified: Secondary | ICD-10-CM | POA: Diagnosis not present

## 2024-01-17 DIAGNOSIS — Z79899 Other long term (current) drug therapy: Secondary | ICD-10-CM

## 2024-01-17 MED ORDER — GABAPENTIN 100 MG PO CAPS: 100.0000 mg | ORAL_CAPSULE | Freq: Every day | ORAL | 2 refills | Status: DC

## 2024-01-17 MED ORDER — QUETIAPINE FUMARATE 100 MG PO TABS: 100.0000 mg | ORAL_TABLET | Freq: Every day | ORAL | 2 refills | Status: DC

## 2024-01-17 MED ORDER — SERTRALINE HCL 25 MG PO TABS: 25.0000 mg | ORAL_TABLET | Freq: Every day | ORAL | 2 refills | Status: DC
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
## 2024-01-25 LAB — CBC WITH DIFFERENTIAL/PLATELET
Basophils Absolute: 0.1 x10E3/uL (ref 0.0–0.2)
Basos: 1 %
EOS (ABSOLUTE): 0.1 x10E3/uL (ref 0.0–0.4)
Eos: 1 %
Hematocrit: 41.6 % (ref 34.0–46.6)
Hemoglobin: 13.7 g/dL (ref 11.1–15.9)
Immature Grans (Abs): 0 x10E3/uL (ref 0.0–0.1)
Immature Granulocytes: 0 %
Lymphocytes Absolute: 2.3 x10E3/uL (ref 0.7–3.1)
Lymphs: 28 %
MCH: 30.4 pg (ref 26.6–33.0)
MCHC: 32.9 g/dL (ref 31.5–35.7)
MCV: 92 fL (ref 79–97)
Monocytes Absolute: 0.7 x10E3/uL (ref 0.1–0.9)
Monocytes: 8 %
Neutrophils Absolute: 5 x10E3/uL (ref 1.4–7.0)
Neutrophils: 62 %
Platelets: 298 x10E3/uL (ref 150–450)
RBC: 4.5 x10E6/uL (ref 3.77–5.28)
RDW: 12.3 % (ref 11.7–15.4)
WBC: 8.1 x10E3/uL (ref 3.4–10.8)

## 2024-01-25 LAB — CMP14+EGFR
ALT: 22 IU/L (ref 0–32)
AST: 24 IU/L (ref 0–40)
Albumin: 4.7 g/dL (ref 4.0–5.0)
Alkaline Phosphatase: 52 IU/L (ref 41–116)
BUN/Creatinine Ratio: 18 (ref 9–23)
BUN: 15 mg/dL (ref 6–20)
Bilirubin Total: <0.2 mg/dL (ref 0.0–1.2)
CO2: 20 mmol/L (ref 20–29)
Calcium: 9.5 mg/dL (ref 8.7–10.2)
Chloride: 100 mmol/L (ref 96–106)
Creatinine, Ser: 0.83 mg/dL (ref 0.57–1.00)
Globulin, Total: 2.7 g/dL (ref 1.5–4.5)
Glucose: 66 mg/dL — ABNORMAL LOW (ref 70–99)
Potassium: 4.2 mmol/L (ref 3.5–5.2)
Sodium: 135 mmol/L (ref 134–144)
Total Protein: 7.4 g/dL (ref 6.0–8.5)
eGFR: 100 mL/min/1.73 (ref >59)

## 2024-01-25 LAB — HEMOGLOBIN A1C
Est. average glucose Bld gHb Est-mCnc: 111 mg/dL
Hgb A1c MFr Bld: 5.5 % (ref 4.8–5.6)

## 2024-01-31 ENCOUNTER — Ambulatory Visit: Payer: Self-pay | Admitting: Physician Assistant

## 2024-02-01 NOTE — Telephone Encounter (Signed)
 Please see pt msg and advise lab results

## 2024-02-02 NOTE — Telephone Encounter (Signed)
 Please inform the patient her blood work test results.  Her chemistry is normal except glucose 66 which is slightly low (normal 70-99) but 43-month blood sugar hemoglobin A1c is normal.  Her kidney function test and liver enzymes are normal.

## 2024-02-03 NOTE — Telephone Encounter (Signed)
 See msg thread as Veronica Best

## 2024-03-14 ENCOUNTER — Ambulatory Visit (INDEPENDENT_AMBULATORY_CARE_PROVIDER_SITE_OTHER)

## 2024-03-14 DIAGNOSIS — Z3042 Encounter for surveillance of injectable contraceptive: Secondary | ICD-10-CM | POA: Diagnosis not present

## 2024-03-14 MED ORDER — MEDROXYPROGESTERONE ACETATE 150 MG/ML IM SUSP
150.0000 mg | Freq: Once | INTRAMUSCULAR | Status: AC
  Administered 2024-03-14: 150 mg via INTRAMUSCULAR

## 2024-03-14 NOTE — Progress Notes (Addendum)
 Patient is in office today for a nurse visit for Birth Control Injection. Patient Injection was given in the  Right upper quad. gluteus. Patient tolerated injection well.Pt was advised to schedule next injection between Feb 10-Feb24 . Pt understood.

## 2024-03-20 ENCOUNTER — Telehealth (HOSPITAL_COMMUNITY): Payer: Self-pay | Admitting: Psychiatry

## 2024-03-20 ENCOUNTER — Encounter (HOSPITAL_COMMUNITY): Payer: Self-pay | Admitting: Psychiatry

## 2024-03-20 VITALS — Wt 175.0 lb

## 2024-03-20 DIAGNOSIS — F4310 Post-traumatic stress disorder, unspecified: Secondary | ICD-10-CM

## 2024-03-20 DIAGNOSIS — F319 Bipolar disorder, unspecified: Secondary | ICD-10-CM | POA: Diagnosis not present

## 2024-03-20 DIAGNOSIS — F338 Other recurrent depressive disorders: Secondary | ICD-10-CM

## 2024-03-20 DIAGNOSIS — F419 Anxiety disorder, unspecified: Secondary | ICD-10-CM

## 2024-03-20 MED ORDER — QUETIAPINE FUMARATE 100 MG PO TABS: 100.0000 mg | ORAL_TABLET | Freq: Every day | ORAL | 2 refills | Status: AC

## 2024-03-20 MED ORDER — GABAPENTIN 100 MG PO CAPS: 100.0000 mg | ORAL_CAPSULE | Freq: Every day | ORAL | 2 refills | Status: AC

## 2024-03-20 MED ORDER — SERTRALINE HCL 25 MG PO TABS: 25.0000 mg | ORAL_TABLET | Freq: Every day | ORAL | 2 refills | Status: AC
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
## 2024-03-30 ENCOUNTER — Telehealth (HOSPITAL_COMMUNITY): Payer: Self-pay | Admitting: Psychiatry

## 2024-05-30 ENCOUNTER — Ambulatory Visit

## 2024-06-01 ENCOUNTER — Ambulatory Visit

## 2024-06-19 ENCOUNTER — Telehealth (HOSPITAL_COMMUNITY): Admitting: Psychiatry

## 2024-08-02 ENCOUNTER — Encounter: Admitting: Physician Assistant
